# Patient Record
Sex: Female | Born: 1975
Health system: Southern US, Community
[De-identification: ages and names within clinical notes are randomized; demographics above are authoritative.]

## PROBLEM LIST (undated history)

## (undated) DIAGNOSIS — K469 Unspecified abdominal hernia without obstruction or gangrene: Secondary | ICD-10-CM

## (undated) DIAGNOSIS — K921 Melena: Secondary | ICD-10-CM

## (undated) DIAGNOSIS — Z87440 Personal history of urinary (tract) infections: Secondary | ICD-10-CM

## (undated) DIAGNOSIS — E669 Obesity, unspecified: Secondary | ICD-10-CM

## (undated) DIAGNOSIS — R32 Unspecified urinary incontinence: Secondary | ICD-10-CM

## (undated) HISTORY — PX: APPENDECTOMY: SHX54

## (undated) HISTORY — DX: Personal history of urinary (tract) infections: Z87.440

## (undated) HISTORY — PX: HERNIA REPAIR: SHX51

## (undated) HISTORY — DX: Melena: K92.1

## (undated) HISTORY — DX: Unspecified urinary incontinence: R32

---

## 2005-12-11 HISTORY — PX: TUBAL LIGATION: SHX77

## 2014-10-02 ENCOUNTER — Encounter (HOSPITAL_COMMUNITY): Payer: Self-pay | Admitting: Emergency Medicine

## 2014-10-02 ENCOUNTER — Emergency Department (HOSPITAL_COMMUNITY)
Admission: EM | Admit: 2014-10-02 | Discharge: 2014-10-02 | Disposition: A | Discharge status: Home / Self Care | Payer: No Typology Code available for payment source | Attending: Emergency Medicine | Admitting: Emergency Medicine

## 2014-10-02 ENCOUNTER — Emergency Department (HOSPITAL_COMMUNITY): Payer: No Typology Code available for payment source

## 2014-10-02 DIAGNOSIS — M549 Dorsalgia, unspecified: Secondary | ICD-10-CM | POA: Insufficient documentation

## 2014-10-02 DIAGNOSIS — R109 Unspecified abdominal pain: Secondary | ICD-10-CM

## 2014-10-02 DIAGNOSIS — K429 Umbilical hernia without obstruction or gangrene: Secondary | ICD-10-CM | POA: Insufficient documentation

## 2014-10-02 DIAGNOSIS — R1033 Periumbilical pain: Secondary | ICD-10-CM | POA: Diagnosis not present

## 2014-10-02 DIAGNOSIS — Z720 Tobacco use: Secondary | ICD-10-CM | POA: Insufficient documentation

## 2014-10-02 DIAGNOSIS — Z3202 Encounter for pregnancy test, result negative: Secondary | ICD-10-CM | POA: Insufficient documentation

## 2014-10-02 DIAGNOSIS — R599 Enlarged lymph nodes, unspecified: Secondary | ICD-10-CM | POA: Insufficient documentation

## 2014-10-02 LAB — CBC WITH DIFFERENTIAL/PLATELET
BASOS PCT: 0 % (ref 0–1)
Basophils Absolute: 0 10*3/uL (ref 0.0–0.1)
EOS ABS: 0.3 10*3/uL (ref 0.0–0.7)
Eosinophils Relative: 2 % (ref 0–5)
HCT: 39 % (ref 36.0–46.0)
Hemoglobin: 13.2 g/dL (ref 12.0–15.0)
LYMPHS ABS: 2.7 10*3/uL (ref 0.7–4.0)
Lymphocytes Relative: 20 % (ref 12–46)
MCH: 29.1 pg (ref 26.0–34.0)
MCHC: 33.8 g/dL (ref 30.0–36.0)
MCV: 85.9 fL (ref 78.0–100.0)
MONOS PCT: 5 % (ref 3–12)
Monocytes Absolute: 0.6 10*3/uL (ref 0.1–1.0)
NEUTROS PCT: 73 % (ref 43–77)
Neutro Abs: 9.8 10*3/uL — ABNORMAL HIGH (ref 1.7–7.7)
PLATELETS: 310 10*3/uL (ref 150–400)
RBC: 4.54 MIL/uL (ref 3.87–5.11)
RDW: 13 % (ref 11.5–15.5)
WBC: 13.4 10*3/uL — ABNORMAL HIGH (ref 4.0–10.5)

## 2014-10-02 LAB — URINALYSIS, ROUTINE W REFLEX MICROSCOPIC
Bilirubin Urine: NEGATIVE
Glucose, UA: NEGATIVE mg/dL
KETONES UR: NEGATIVE mg/dL
Leukocytes, UA: NEGATIVE
NITRITE: NEGATIVE
Protein, ur: NEGATIVE mg/dL
Specific Gravity, Urine: 1.005 (ref 1.005–1.030)
UROBILINOGEN UA: 0.2 mg/dL (ref 0.0–1.0)
pH: 5.5 (ref 5.0–8.0)

## 2014-10-02 LAB — BASIC METABOLIC PANEL
Anion gap: 8 (ref 5–15)
BUN: 9 mg/dL (ref 6–23)
CALCIUM: 8.7 mg/dL (ref 8.4–10.5)
CO2: 27 mEq/L (ref 19–32)
Chloride: 106 mEq/L (ref 96–112)
Creatinine, Ser: 0.87 mg/dL (ref 0.50–1.10)
GFR, EST NON AFRICAN AMERICAN: 83 mL/min — AB (ref >90)
GLUCOSE: 72 mg/dL (ref 70–99)
POTASSIUM: 4.1 meq/L (ref 3.7–5.3)
SODIUM: 141 meq/L (ref 137–147)

## 2014-10-02 LAB — URINE MICROSCOPIC-ADD ON: RBC / HPF: NONE SEEN RBC/hpf (ref <3)

## 2014-10-02 LAB — POC URINE PREG, ED: PREG TEST UR: NEGATIVE

## 2014-10-02 MED ORDER — METHOCARBAMOL 500 MG PO TABS: 500.0000 mg | ORAL_TABLET | Freq: Two times a day (BID) | ORAL | Status: DC

## 2014-10-02 MED ORDER — OXYCODONE-ACETAMINOPHEN 5-325 MG PO TABS
1.0000 | ORAL_TABLET | Freq: Once | ORAL | Status: AC
  Administered 2014-10-02: 1 via ORAL
  Filled 2014-10-02: qty 1

## 2014-10-02 MED ORDER — MORPHINE SULFATE 4 MG/ML IJ SOLN
4.0000 mg | Freq: Once | INTRAMUSCULAR | Status: AC
  Administered 2014-10-02: 4 mg via INTRAVENOUS
  Filled 2014-10-02: qty 1

## 2014-10-02 NOTE — Discharge Instructions (Signed)
Return to the emergency room with worsening of symptoms, new symptoms or with symptoms that are concerning, especially fevers, loss of control of bladder or bowels, numbness or tingling around genital region or anus, weakness. Also if your hernia becomes painful, hard, red with severe abdominal pain, nausea, vomiting, fevers. RICE: Rest, Ice (three cycles of 20 mins on, 20mins off at least twice a day), compression/brace, elevation. Heating pad works well for back pain. Ibuprofen 400mg  (2 tablets 200mg ) every 5-6 hours for 3-5 days and then as needed for pain. Muscle relaxer for severe pain. Do not operate machinery, drive or drink alcohol while taking narcotics or muscle relaxers. Follow up with PCP if symptoms worsen or are persistent.

## 2014-10-02 NOTE — ED Provider Notes (Signed)
CSN: [id number]     Arrival date & time 10/02/14  1641 History   First MD Initiated Contact with Patient 10/02/14 1723     Chief Complaint  Patient presents with  . Flank Pain     (Consider location/radiation/quality/duration/timing/severity/associated sxs/prior Treatment) HPI Grace Todd is a 38 y.o. female presenting with left sided flank pain that is sharp, crampy started at 2:30pm today. Pain is constant but acutely worsens every 5mins or so. Pain does not radiate. Worse when patient bends over. Patient denies hematuria, dysuria, foul odor to urine but endorses frequency. Patient has never had pain like this before. No nausea, emesis. No abdominal pain, chest pain, SOB. No fevers, chills. Patient back denies injury or history of back pain. LMP started 3 days ago but this is unlike menses pain. No vaginal complaints. No history of kidney stones. No fevers, chills, night sweats, weight loss, IVDU, history of malignancy. No loss of control of bladder or bowel. No numbness/tingling, weakness or saddle anesthesia.     History reviewed. No pertinent past medical history. History reviewed. No pertinent past surgical history. History reviewed. No pertinent family history. History  Substance Use Topics  . Smoking status: Current Every Day Smoker    Types: Cigarettes  . Smokeless tobacco: Not on file  . Alcohol Use: No   OB History   Grav Para Term Preterm Abortions TAB SAB Ect Mult Living                 Review of Systems  Constitutional: Negative for fever and chills.  HENT: Negative for congestion and rhinorrhea.   Eyes: Negative for visual disturbance.  Respiratory: Negative for cough and shortness of breath.   Cardiovascular: Negative for chest pain and palpitations.  Gastrointestinal: Negative for nausea, vomiting and diarrhea.  Genitourinary: Negative for dysuria and hematuria.  Musculoskeletal: Positive for back pain. Negative for gait problem.  Skin: Negative for rash.   Neurological: Negative for weakness and headaches.      Allergies  Sulfa antibiotics  Home Medications   Prior to Admission medications   Not on File   BP 131/87  Pulse 82  Temp(Src) 98.2 F (36.8 C) (Oral)  Resp 16  Wt 258 lb 5 oz (117.17 kg)  SpO2 98%  LMP 09/30/2014 Physical Exam  Nursing note and vitals reviewed. Constitutional: She appears well-developed and well-nourished. No distress.  HENT:  Head: Normocephalic and atraumatic.  Eyes: Conjunctivae are normal. Right eye exhibits no discharge. Left eye exhibits no discharge.  Cardiovascular: Normal rate, regular rhythm and normal heart sounds.   Pulmonary/Chest: Effort normal and breath sounds normal. No respiratory distress. She has no wheezes.  Abdominal: Soft. Bowel sounds are normal. She exhibits no distension. There is no tenderness.  5cm umbilical hernia without tenderness. Non firm without erythema, or skin changes.  Musculoskeletal:  No midline back tenderness, step off or crepitus.  Left sided flank back tenderness. No CVA tenderness.   Neurological: She is alert. Coordination normal.  Equal muscle tone. 5/5 strength in lower extremities. DTR equal and intact. Negative straight leg test. Normal gait.   Skin: Skin is warm and dry. She is not diaphoretic.    ED Course  Procedures (including critical care time) Labs Review Labs Reviewed  URINALYSIS, ROUTINE W REFLEX MICROSCOPIC  CBC WITH DIFFERENTIAL  BASIC METABOLIC PANEL  POC URINE PREG, ED    Imaging Review No results found.   EKG Interpretation None     Meds given in ED:  Medications  oxyCODONE-acetaminophen (PERCOCET/ROXICET) 5-325 MG per tablet 1 tablet (1 tablet Oral Given 10/02/14 1653)  morphine 4 MG/ML injection 4 mg (4 mg Intravenous Given 10/02/14 1750)    Discharge Medication List as of 10/02/2014  7:02 PM    START taking these medications   Details  methocarbamol (ROBAXIN) 500 MG tablet Take 1 tablet (500 mg total) by  mouth 2 (two) times daily., Starting 10/02/2014, Until Discontinued, Print          MDM   Final diagnoses:  Flank pain   Patient with back pain. No loss of bowel or bladder control. No saddle anesthesia. No fever, night sweats, weight loss, h/o cancer, IVDU. VSS. No neurological deficits and normal neuro exam. Patient can ambulate. No concern for cauda equina.  Patient with hematuria but currently on period. UA without infection. CT renal without signs of nephrolithiasis. CT revealed mesenteric and retroperitoneal lymph nodes and recommends repeat CT in 3-4 months. Patient with PCP appointment in one week and to discuss this recommendation. Tx back pain with RICE protocol and muscle relaxer indicated and discussed with patient. Driving and sedation precautions provided. Patient is afebrile, nontoxic, and in no acute distress. Patient is appropriate for outpatient management and is stable for discharge.  Discussed return precautions with patient. Discussed all results and patient verbalizes understanding and agrees with plan.  Case has been discussed with Dr. Judd Lienelo who agrees with the above plan and to discharge.      Louann SjogrenVictoria L Lynda Wanninger, PA-C 10/02/14 2233

## 2014-10-02 NOTE — ED Notes (Signed)
Presents with left sided flank pain began at 2:30 today. Denies injury, pain is described as cramping and comes and goes, worse when bends over and moves. reports urinary frequency. Denies SOB. Pain does not radiate or move.

## 2014-10-03 NOTE — ED Provider Notes (Signed)
Medical screening examination/treatment/procedure(s) were performed by non-physician practitioner and as supervising physician I was immediately available for consultation/collaboration.     Geoffery Lyonsouglas Shykeria Sakamoto, MD 10/03/14 [phone]

## 2016-10-13 ENCOUNTER — Encounter: Payer: Self-pay | Admitting: Gastroenterology

## 2016-11-23 DIAGNOSIS — K469 Unspecified abdominal hernia without obstruction or gangrene: Secondary | ICD-10-CM | POA: Insufficient documentation

## 2016-11-23 DIAGNOSIS — R109 Unspecified abdominal pain: Secondary | ICD-10-CM | POA: Diagnosis present

## 2016-11-23 DIAGNOSIS — F17210 Nicotine dependence, cigarettes, uncomplicated: Secondary | ICD-10-CM | POA: Diagnosis not present

## 2016-11-24 ENCOUNTER — Encounter (HOSPITAL_COMMUNITY): Payer: Self-pay | Admitting: Emergency Medicine

## 2016-11-24 ENCOUNTER — Emergency Department (HOSPITAL_COMMUNITY): Payer: BLUE CROSS/BLUE SHIELD

## 2016-11-24 ENCOUNTER — Emergency Department (HOSPITAL_COMMUNITY)
Admission: EM | Admit: 2016-11-24 | Discharge: 2016-11-24 | Disposition: A | Discharge status: Home / Self Care | Payer: BLUE CROSS/BLUE SHIELD | Attending: Emergency Medicine | Admitting: Emergency Medicine

## 2016-11-24 DIAGNOSIS — K469 Unspecified abdominal hernia without obstruction or gangrene: Secondary | ICD-10-CM

## 2016-11-24 HISTORY — DX: Obesity, unspecified: E66.9

## 2016-11-24 HISTORY — DX: Unspecified abdominal hernia without obstruction or gangrene: K46.9

## 2016-11-24 LAB — I-STAT CG4 LACTIC ACID, ED: Lactic Acid, Venous: 1.13 mmol/L (ref 0.5–1.9)

## 2016-11-24 LAB — COMPREHENSIVE METABOLIC PANEL
ALK PHOS: 52 U/L (ref 38–126)
ALT: 21 U/L (ref 14–54)
AST: 20 U/L (ref 15–41)
Albumin: 3.5 g/dL (ref 3.5–5.0)
Anion gap: 6 (ref 5–15)
BUN: 10 mg/dL (ref 6–20)
CO2: 28 mmol/L (ref 22–32)
Calcium: 8.9 mg/dL (ref 8.9–10.3)
Chloride: 106 mmol/L (ref 101–111)
Creatinine, Ser: 0.86 mg/dL (ref 0.44–1.00)
GFR calc Af Amer: >60 mL/min (ref >60)
GFR calc non Af Amer: >60 mL/min (ref >60)
Glucose, Bld: 120 mg/dL — ABNORMAL HIGH (ref 65–99)
POTASSIUM: 4.4 mmol/L (ref 3.5–5.1)
Sodium: 140 mmol/L (ref 135–145)
Total Bilirubin: 0.3 mg/dL (ref 0.3–1.2)
Total Protein: 6.1 g/dL — ABNORMAL LOW (ref 6.5–8.1)

## 2016-11-24 LAB — URINALYSIS, ROUTINE W REFLEX MICROSCOPIC
BILIRUBIN URINE: NEGATIVE
Bacteria, UA: NONE SEEN
GLUCOSE, UA: NEGATIVE mg/dL
Ketones, ur: 5 mg/dL — AB
LEUKOCYTES UA: NEGATIVE
Nitrite: NEGATIVE
PH: 5 (ref 5.0–8.0)
Protein, ur: 30 mg/dL — AB
SPECIFIC GRAVITY, URINE: 1.027 (ref 1.005–1.030)

## 2016-11-24 LAB — CBC
HEMATOCRIT: 38.8 % (ref 36.0–46.0)
Hemoglobin: 12.6 g/dL (ref 12.0–15.0)
MCH: 27.7 pg (ref 26.0–34.0)
MCHC: 32.5 g/dL (ref 30.0–36.0)
MCV: 85.3 fL (ref 78.0–100.0)
PLATELETS: 339 10*3/uL (ref 150–400)
RBC: 4.55 MIL/uL (ref 3.87–5.11)
RDW: 13.8 % (ref 11.5–15.5)
WBC: 17.1 10*3/uL — AB (ref 4.0–10.5)

## 2016-11-24 LAB — LIPASE, BLOOD: Lipase: 31 U/L (ref 11–51)

## 2016-11-24 LAB — POC URINE PREG, ED: Preg Test, Ur: NEGATIVE

## 2016-11-24 MED ORDER — OXYCODONE-ACETAMINOPHEN 5-325 MG PO TABS: 1.0000 | ORAL_TABLET | Freq: Four times a day (QID) | ORAL | 0 refills | Status: DC | PRN

## 2016-11-24 MED ORDER — IOPAMIDOL (ISOVUE-300) INJECTION 61%
INTRAVENOUS | Status: AC
  Administered 2016-11-24: 100 mL
  Filled 2016-11-24: qty 100

## 2016-11-24 MED ORDER — ONDANSETRON HCL 4 MG/2ML IJ SOLN
4.0000 mg | Freq: Once | INTRAMUSCULAR | Status: AC
  Administered 2016-11-24: 4 mg via INTRAVENOUS
  Filled 2016-11-24: qty 2

## 2016-11-24 MED ORDER — SODIUM CHLORIDE 0.9 % IV SOLN
INTRAVENOUS | Status: DC
  Administered 2016-11-24: 01:00:00 via INTRAVENOUS

## 2016-11-24 MED ORDER — HYDROMORPHONE HCL 2 MG/ML IJ SOLN
1.0000 mg | Freq: Once | INTRAMUSCULAR | Status: AC
  Administered 2016-11-24: 1 mg via INTRAVENOUS
  Filled 2016-11-24: qty 1

## 2016-11-24 MED ORDER — ONDANSETRON 4 MG PO TBDP: 4.0000 mg | ORAL_TABLET | Freq: Three times a day (TID) | ORAL | 0 refills | Status: DC | PRN

## 2016-11-24 NOTE — ED Notes (Signed)
 1mg  of Dilaudid WIS and verified w/ Victorino DikeJennifer, RN.

## 2016-11-24 NOTE — ED Provider Notes (Signed)
By signing my name below, I, Alyssa GroveMartin Green, attest that this documentation has been prepared under the direction and in the presence of Yianni Skilling N Zebulon Gantt, DO. Electronically Signed: Alyssa GroveMartin Green, ED Scribe. 11/24/16. 1:04 AM.   TIME SEEN: 12:55 AM   CHIEF COMPLAINT: Abdominal Pain; Vomiting  HPI: Grace Todd is a 40 y.o. female who presents to the Emergency Department complaining of gradual onset, constant, abdominal pain onset 9 PM tonight. She reports associated diarrhea, vomiting, nausea, lightheadedness and abdominal distension. At 9 PM pt, started to feel nauseous and lightheaded before abdominal pain significantly increased. She has thrown up 2x today, once at work and once later tonight with a small amount of blood. Pt has hx of hernia that she states slightly swells month to month, but causes no significant pain. States that tonight it has been significantly swollen and unable to reduce. This is abnormal for her. She has eaten a Big Mac for dinner at 8 PM and a steak biscuit for breakfast. Abdominal PSHx of Tubal ligation. She denies blood in stool, melena, hematuria, recent abnormal vaginal bleeding or discharge and dysuria. Pt states she is currently on her period.  ROS: See HPI Constitutional: no fever  Eyes: no drainage  ENT: no runny nose   Cardiovascular:  no chest pain  Resp: no SOB  GI: Vomiting GU: no dysuria Integumentary: no rash  Allergy: no hives  Musculoskeletal: no leg swelling  Neurological: no slurred speech ROS otherwise negative  PAST MEDICAL HISTORY/PAST SURGICAL HISTORY:  Past Medical History:  Diagnosis Date  . Abdominal hernia   . Obesity     MEDICATIONS:  Prior to Admission medications   Medication Sig Start Date End Date Taking? Authorizing Provider  methocarbamol (ROBAXIN) 500 MG tablet Take 1 tablet (500 mg total) by mouth 2 (two) times daily. 10/02/14   Oswaldo ConroyVictoria Creech, PA-C    ALLERGIES:  Allergies  Allergen Reactions  . Sulfa Antibiotics  Hives    SOCIAL HISTORY:  Social History  Substance Use Topics  . Smoking status: Current Every Day Smoker    Types: Cigarettes  . Smokeless tobacco: Never Used  . Alcohol use No    FAMILY HISTORY: No family history on file.  EXAM: BP (!) 161/107 (BP Location: Right Arm)   Pulse 77   Temp 98.4 F (36.9 C) (Oral)   Resp 22   LMP 11/23/2016   SpO2 100%  CONSTITUTIONAL: Alert and oriented and responds appropriately to questions. Appears very uncomfortable, crying HEAD: Normocephalic EYES: Conjunctivae clear, PERRL, EOMI ENT: normal nose; no rhinorrhea; moist mucous membranes NECK: Supple, no meningismus, no nuchal rigidity, no LAD  CARD: RRR; S1 and S2 appreciated; no murmurs, no clicks, no rubs, no gallops RESP: Normal chest excursion without splinting or tachypnea; breath sounds clear and equal bilaterally; no wheezes, no rhonchi, no rales, no hypoxia or respiratory distress, speaking full sentences ABD/GI: Normal bowel sounds; non-distended; soft, tender to palpation throughout the abdomen especially over the umbilical hernia which is approximate 6 x 6 cm with mild amount of overlying ecchymosis but no redness or warmth, induration or fluctuance. This area is slightly hard to palpation and I am unable to reduce it.  No rebound, no guarding, no peritoneal signs, no hepatosplenomegaly BACK:  The back appears normal and is non-tender to palpation, there is no CVA tenderness EXT: Normal ROM in all joints; non-tender to palpation; no edema; normal capillary refill; no cyanosis, no calf tenderness or swelling    SKIN: Normal color for age  and race; warm; no rash NEURO: Moves all extremities equally, sensation to light touch intact diffusely, cranial nerves II through XII intact, normal speech PSYCH: The patient's mood and manner are appropriate. Grooming and personal hygiene are appropriate.  MEDICAL DECISION MAKING: Patient here with what looks like an incarcerated hernia. I am unable  to reduce it at bedside and she appears free uncomfortable. Labs ordered in triage show leukocytosis but otherwise unremarkable. I have discussed with Dr. Andrey CampanileWilson with general surgery. He recommends keeping the patient NPO, obtaining a CT with IV contrast only and placing an NG tube. Patient is receiving IV fluids, pain and nausea medication.  ED PROGRESS: 1:45 AM  On reevaluation, patient's pain is improved and I am able to reduce her hernia. Have contacted general surgery to update Dr. Andrey CampanileWilson. Appreciate his help. Will hold on NG tube at this time. We'll proceed with CT scan she states she is still very uncomfortable.  2:55 AM  Pt's CT scan shows increase of the size a fat-containing hernia superior to the umbilicus measuring 8.7 cm. There is increased density of herniated fat and small volume of fluid that may represent incarceration and possible fat infarct. Patient still having significant amount of pain although improved. It doesn't appear that she has any bowel in her hernia at this time and I am able to palpate her hernia and reduce it with some tenderness but no sign of bowel incarceration. I do not feel that there is anything that needs to be done emergently but will discuss with general surgery.  Otherwise CT scan is unremarkable. Appendix is normal.   3:15 AM  Pt reports feeling better. Abdomen is still soft, nontender to the with reducible hernia. Pleural fluid challenge patient. Discussed with Dr. Andrey CampanileWilson with general surgery. We appreciate his help. He agrees that there is nothing that would be done emergently for incarcerated fat but will give her close outpatient follow-up with surgery. Have discussed with her return precautions including worsening pain, fever, vomiting that will not stop. Have discussed with her and her husband how to try to reduce her hernia at home if she begins having pain, increased bulging in this area. She verbalizes understanding and is comfortable with this plan. We'll  discharge with Percocet, Zofran.   At this time, I do not feel there is any life-threatening condition present. I have reviewed and discussed all results (EKG, imaging, lab, urine as appropriate) and exam findings with patient/family. I have reviewed nursing notes and appropriate previous records.  I feel the patient is safe to be discharged home without further emergent workup and can continue workup as an outpatient as needed. Discussed usual and customary return precautions. Patient/family verbalize understanding and are comfortable with this plan.  Outpatient follow-up has been provided. All questions have been answered.   I personally performed the services described in this documentation, which was scribed in my presence. The recorded information has been reviewed and is accurate.    Layla MawKristen N Halvor Behrend, DO 11/24/16 [id number]

## 2016-11-24 NOTE — ED Triage Notes (Signed)
Pt. reports mid/lower abdominal pain with nausea and emesis onset this evening after eating BigMac , denies diarrhea or fever , pt. added umbilical hernia that is increasing in size this week .

## 2016-12-15 ENCOUNTER — Other Ambulatory Visit: Payer: Self-pay | Admitting: Surgery

## 2016-12-18 ENCOUNTER — Ambulatory Visit: Payer: No Typology Code available for payment source | Admitting: Gastroenterology

## 2017-09-06 ENCOUNTER — Encounter: Payer: Self-pay | Admitting: Family Medicine

## 2018-02-14 ENCOUNTER — Other Ambulatory Visit: Payer: Self-pay

## 2018-02-14 ENCOUNTER — Encounter (HOSPITAL_COMMUNITY): Admission: EM | Disposition: A | Discharge status: Home Health | Payer: Self-pay | Source: Home / Self Care

## 2018-02-14 ENCOUNTER — Emergency Department (HOSPITAL_COMMUNITY): Payer: BLUE CROSS/BLUE SHIELD | Admitting: Anesthesiology

## 2018-02-14 ENCOUNTER — Emergency Department (HOSPITAL_COMMUNITY): Payer: BLUE CROSS/BLUE SHIELD

## 2018-02-14 ENCOUNTER — Inpatient Hospital Stay (HOSPITAL_COMMUNITY)
Admission: EM | Admit: 2018-02-14 | Discharge: 2018-02-17 | DRG: 354 | Disposition: A | Discharge status: Home Health | Payer: BLUE CROSS/BLUE SHIELD | Attending: General Surgery | Admitting: General Surgery

## 2018-02-14 ENCOUNTER — Encounter (HOSPITAL_COMMUNITY): Payer: Self-pay

## 2018-02-14 DIAGNOSIS — Z6841 Body Mass Index (BMI) 40.0 and over, adult: Secondary | ICD-10-CM | POA: Diagnosis not present

## 2018-02-14 DIAGNOSIS — R109 Unspecified abdominal pain: Secondary | ICD-10-CM | POA: Diagnosis present

## 2018-02-14 DIAGNOSIS — F17210 Nicotine dependence, cigarettes, uncomplicated: Secondary | ICD-10-CM | POA: Diagnosis present

## 2018-02-14 DIAGNOSIS — K450 Other specified abdominal hernia with obstruction, without gangrene: Secondary | ICD-10-CM

## 2018-02-14 DIAGNOSIS — K436 Other and unspecified ventral hernia with obstruction, without gangrene: Secondary | ICD-10-CM | POA: Diagnosis present

## 2018-02-14 DIAGNOSIS — D72829 Elevated white blood cell count, unspecified: Secondary | ICD-10-CM | POA: Diagnosis present

## 2018-02-14 HISTORY — PX: LAPAROSCOPY: SHX197

## 2018-02-14 HISTORY — PX: UMBILICAL HERNIA REPAIR: SHX196

## 2018-02-14 LAB — BASIC METABOLIC PANEL
ANION GAP: 9 (ref 5–15)
BUN: 13 mg/dL (ref 6–20)
CALCIUM: 9 mg/dL (ref 8.9–10.3)
CO2: 23 mmol/L (ref 22–32)
Chloride: 105 mmol/L (ref 101–111)
Creatinine, Ser: 0.8 mg/dL (ref 0.44–1.00)
Glucose, Bld: 116 mg/dL — ABNORMAL HIGH (ref 65–99)
POTASSIUM: 4 mmol/L (ref 3.5–5.1)
SODIUM: 137 mmol/L (ref 135–145)

## 2018-02-14 LAB — CBC WITH DIFFERENTIAL/PLATELET
BASOS ABS: 0 10*3/uL (ref 0.0–0.1)
BASOS PCT: 0 %
EOS PCT: 0 %
Eosinophils Absolute: 0 10*3/uL (ref 0.0–0.7)
HCT: 39.1 % (ref 36.0–46.0)
Hemoglobin: 13 g/dL (ref 12.0–15.0)
LYMPHS PCT: 8 %
Lymphs Abs: 1.6 10*3/uL (ref 0.7–4.0)
MCH: 28.6 pg (ref 26.0–34.0)
MCHC: 33.2 g/dL (ref 30.0–36.0)
MCV: 85.9 fL (ref 78.0–100.0)
Monocytes Absolute: 0.3 10*3/uL (ref 0.1–1.0)
Monocytes Relative: 2 %
Neutro Abs: 19 10*3/uL — ABNORMAL HIGH (ref 1.7–7.7)
Neutrophils Relative %: 90 %
PLATELETS: 350 10*3/uL (ref 150–400)
RBC: 4.55 MIL/uL (ref 3.87–5.11)
RDW: 13.9 % (ref 11.5–15.5)
WBC: 20.9 10*3/uL — AB (ref 4.0–10.5)

## 2018-02-14 LAB — I-STAT BETA HCG BLOOD, ED (MC, WL, AP ONLY): I-stat hCG, quantitative: <5 m[IU]/mL (ref <5)

## 2018-02-14 SURGERY — LAPAROSCOPY, DIAGNOSTIC
Anesthesia: General | Site: Abdomen

## 2018-02-14 MED ORDER — ONDANSETRON HCL 4 MG/2ML IJ SOLN: 4.0000 mg | Freq: Once | INTRAMUSCULAR | Status: DC | PRN

## 2018-02-14 MED ORDER — ROCURONIUM BROMIDE 100 MG/10ML IV SOLN
INTRAVENOUS | Status: DC | PRN
  Administered 2018-02-14: 50 mg via INTRAVENOUS
  Administered 2018-02-14: 20 mg via INTRAVENOUS

## 2018-02-14 MED ORDER — HYDROMORPHONE HCL 1 MG/ML IJ SOLN
INTRAMUSCULAR | Status: AC
  Filled 2018-02-14: qty 1

## 2018-02-14 MED ORDER — SUCCINYLCHOLINE CHLORIDE 20 MG/ML IJ SOLN
INTRAMUSCULAR | Status: AC
  Filled 2018-02-14: qty 1

## 2018-02-14 MED ORDER — ACETAMINOPHEN 325 MG PO TABS
650.0000 mg | ORAL_TABLET | Freq: Four times a day (QID) | ORAL | Status: DC
  Administered 2018-02-15 – 2018-02-17 (×8): 650 mg via ORAL
  Filled 2018-02-14 (×9): qty 2

## 2018-02-14 MED ORDER — HYDROMORPHONE HCL 1 MG/ML IJ SOLN
0.2500 mg | INTRAMUSCULAR | Status: DC | PRN
  Administered 2018-02-14: 0.5 mg via INTRAVENOUS
  Administered 2018-02-14: 0.25 mg via INTRAVENOUS
  Administered 2018-02-14 (×2): 0.5 mg via INTRAVENOUS

## 2018-02-14 MED ORDER — PANTOPRAZOLE SODIUM 40 MG IV SOLR
40.0000 mg | Freq: Every day | INTRAVENOUS | Status: DC
  Administered 2018-02-15 (×2): 40 mg via INTRAVENOUS
  Filled 2018-02-14 (×2): qty 40

## 2018-02-14 MED ORDER — KETOROLAC TROMETHAMINE 30 MG/ML IJ SOLN
30.0000 mg | Freq: Three times a day (TID) | INTRAMUSCULAR | Status: AC
  Administered 2018-02-14 – 2018-02-16 (×6): 30 mg via INTRAVENOUS
  Filled 2018-02-14 (×6): qty 1

## 2018-02-14 MED ORDER — MIDAZOLAM HCL 5 MG/5ML IJ SOLN
INTRAMUSCULAR | Status: DC | PRN
  Administered 2018-02-14: 2 mg via INTRAVENOUS

## 2018-02-14 MED ORDER — PROPOFOL 10 MG/ML IV BOLUS
INTRAVENOUS | Status: AC
  Filled 2018-02-14: qty 40

## 2018-02-14 MED ORDER — DEXAMETHASONE SODIUM PHOSPHATE 10 MG/ML IJ SOLN
INTRAMUSCULAR | Status: DC | PRN
  Administered 2018-02-14: 10 mg via INTRAVENOUS

## 2018-02-14 MED ORDER — DEXAMETHASONE SODIUM PHOSPHATE 10 MG/ML IJ SOLN
INTRAMUSCULAR | Status: AC
  Filled 2018-02-14: qty 1

## 2018-02-14 MED ORDER — FENTANYL CITRATE (PF) 250 MCG/5ML IJ SOLN
INTRAMUSCULAR | Status: AC
  Filled 2018-02-14: qty 5

## 2018-02-14 MED ORDER — FENTANYL CITRATE (PF) 250 MCG/5ML IJ SOLN
INTRAMUSCULAR | Status: DC | PRN
  Administered 2018-02-14 (×7): 50 ug via INTRAVENOUS

## 2018-02-14 MED ORDER — ONDANSETRON HCL 4 MG/2ML IJ SOLN
INTRAMUSCULAR | Status: AC
  Filled 2018-02-14: qty 2

## 2018-02-14 MED ORDER — EPHEDRINE 5 MG/ML INJ
INTRAVENOUS | Status: AC
  Filled 2018-02-14: qty 10

## 2018-02-14 MED ORDER — ROCURONIUM BROMIDE 10 MG/ML (PF) SYRINGE
PREFILLED_SYRINGE | INTRAVENOUS | Status: AC
  Filled 2018-02-14: qty 5

## 2018-02-14 MED ORDER — LACTATED RINGERS IV SOLN
INTRAVENOUS | Status: DC | PRN
  Administered 2018-02-14 (×2): via INTRAVENOUS

## 2018-02-14 MED ORDER — BUPIVACAINE-EPINEPHRINE (PF) 0.5% -1:200000 IJ SOLN
INTRAMUSCULAR | Status: AC
  Filled 2018-02-14: qty 30

## 2018-02-14 MED ORDER — BUPIVACAINE-EPINEPHRINE (PF) 0.5% -1:200000 IJ SOLN
INTRAMUSCULAR | Status: DC | PRN
  Administered 2018-02-14: 30 mL via PERINEURAL

## 2018-02-14 MED ORDER — SUGAMMADEX SODIUM 200 MG/2ML IV SOLN
INTRAVENOUS | Status: DC | PRN
  Administered 2018-02-14: 300 mg via INTRAVENOUS

## 2018-02-14 MED ORDER — ONDANSETRON HCL 4 MG/2ML IJ SOLN
4.0000 mg | Freq: Once | INTRAMUSCULAR | Status: AC
  Administered 2018-02-14: 4 mg via INTRAVENOUS
  Filled 2018-02-14: qty 2

## 2018-02-14 MED ORDER — MEPERIDINE HCL 50 MG/ML IJ SOLN: 6.2500 mg | INTRAMUSCULAR | Status: DC | PRN

## 2018-02-14 MED ORDER — ONDANSETRON HCL 4 MG/2ML IJ SOLN: 4.0000 mg | Freq: Four times a day (QID) | INTRAMUSCULAR | Status: DC | PRN

## 2018-02-14 MED ORDER — BUPIVACAINE HCL (PF) 0.25 % IJ SOLN
INTRAMUSCULAR | Status: AC
  Filled 2018-02-14: qty 30

## 2018-02-14 MED ORDER — MORPHINE SULFATE (PF) 4 MG/ML IV SOLN: 1.0000 mg | INTRAVENOUS | Status: DC | PRN

## 2018-02-14 MED ORDER — METHOCARBAMOL 500 MG PO TABS
500.0000 mg | ORAL_TABLET | Freq: Four times a day (QID) | ORAL | Status: DC | PRN
  Administered 2018-02-15 – 2018-02-17 (×5): 500 mg via ORAL
  Filled 2018-02-14 (×5): qty 1

## 2018-02-14 MED ORDER — CEFAZOLIN SODIUM 1 G IJ SOLR
INTRAMUSCULAR | Status: AC
  Filled 2018-02-14: qty 20

## 2018-02-14 MED ORDER — MIDAZOLAM HCL 2 MG/2ML IJ SOLN
INTRAMUSCULAR | Status: AC
  Filled 2018-02-14: qty 2

## 2018-02-14 MED ORDER — PROPOFOL 10 MG/ML IV BOLUS
INTRAVENOUS | Status: DC | PRN
  Administered 2018-02-14: 160 mg via INTRAVENOUS

## 2018-02-14 MED ORDER — SUCCINYLCHOLINE CHLORIDE 20 MG/ML IJ SOLN
INTRAMUSCULAR | Status: DC | PRN
  Administered 2018-02-14: 100 mg via INTRAVENOUS

## 2018-02-14 MED ORDER — KCL IN DEXTROSE-NACL 20-5-0.45 MEQ/L-%-% IV SOLN
INTRAVENOUS | Status: DC
  Administered 2018-02-14 – 2018-02-16 (×4): via INTRAVENOUS
  Filled 2018-02-14 (×4): qty 1000

## 2018-02-14 MED ORDER — ONDANSETRON 4 MG PO TBDP: 4.0000 mg | ORAL_TABLET | Freq: Four times a day (QID) | ORAL | Status: DC | PRN

## 2018-02-14 MED ORDER — 0.9 % SODIUM CHLORIDE (POUR BTL) OPTIME
TOPICAL | Status: DC | PRN
  Administered 2018-02-14: 1000 mL

## 2018-02-14 MED ORDER — HYDROMORPHONE HCL 1 MG/ML IJ SOLN
1.0000 mg | Freq: Once | INTRAMUSCULAR | Status: AC
  Administered 2018-02-14: 1 mg via INTRAVENOUS
  Filled 2018-02-14: qty 1

## 2018-02-14 MED ORDER — OXYCODONE HCL 5 MG PO TABS
5.0000 mg | ORAL_TABLET | ORAL | Status: DC | PRN
  Administered 2018-02-15 – 2018-02-16 (×5): 10 mg via ORAL
  Filled 2018-02-14 (×5): qty 2

## 2018-02-14 MED ORDER — SODIUM CHLORIDE 0.9 % IV BOLUS (SEPSIS)
1000.0000 mL | Freq: Once | INTRAVENOUS | Status: AC
  Administered 2018-02-14: 1000 mL via INTRAVENOUS

## 2018-02-14 MED ORDER — GABAPENTIN 300 MG PO CAPS
300.0000 mg | ORAL_CAPSULE | Freq: Two times a day (BID) | ORAL | Status: DC
  Administered 2018-02-15 – 2018-02-17 (×5): 300 mg via ORAL
  Filled 2018-02-14 (×5): qty 1

## 2018-02-14 MED ORDER — IOPAMIDOL (ISOVUE-300) INJECTION 61%
INTRAVENOUS | Status: AC
  Administered 2018-02-14: 100 mL
  Filled 2018-02-14: qty 100

## 2018-02-14 MED ORDER — CEFAZOLIN SODIUM-DEXTROSE 2-4 GM/100ML-% IV SOLN
2.0000 g | Freq: Three times a day (TID) | INTRAVENOUS | Status: DC
  Administered 2018-02-14: 2 g via INTRAVENOUS
  Filled 2018-02-14 (×2): qty 100

## 2018-02-14 MED ORDER — DIPHENHYDRAMINE HCL 12.5 MG/5ML PO ELIX: 12.5000 mg | ORAL_SOLUTION | Freq: Four times a day (QID) | ORAL | Status: DC | PRN

## 2018-02-14 MED ORDER — DIPHENHYDRAMINE HCL 50 MG/ML IJ SOLN: 12.5000 mg | Freq: Four times a day (QID) | INTRAMUSCULAR | Status: DC | PRN

## 2018-02-14 MED ORDER — FENTANYL CITRATE (PF) 250 MCG/5ML IJ SOLN
INTRAMUSCULAR | Status: AC
Start: 2018-02-14 — End: ?
  Filled 2018-02-14: qty 5

## 2018-02-14 MED ORDER — ENOXAPARIN SODIUM 40 MG/0.4ML ~~LOC~~ SOLN
40.0000 mg | SUBCUTANEOUS | Status: DC
  Administered 2018-02-15 – 2018-02-17 (×3): 40 mg via SUBCUTANEOUS
  Filled 2018-02-14 (×3): qty 0.4

## 2018-02-14 MED ORDER — ONDANSETRON HCL 4 MG/2ML IJ SOLN
INTRAMUSCULAR | Status: DC | PRN
  Administered 2018-02-14: 4 mg via INTRAVENOUS

## 2018-02-14 MED ORDER — IOPAMIDOL (ISOVUE-300) INJECTION 61%
INTRAVENOUS | Status: AC
  Filled 2018-02-14: qty 100

## 2018-02-14 MED ORDER — LIDOCAINE HCL (CARDIAC) 20 MG/ML IV SOLN
INTRAVENOUS | Status: DC | PRN
  Administered 2018-02-14: 80 mg via INTRATRACHEAL

## 2018-02-14 MED ORDER — SUGAMMADEX SODIUM 500 MG/5ML IV SOLN
INTRAVENOUS | Status: AC
  Filled 2018-02-14: qty 5

## 2018-02-14 SURGICAL SUPPLY — 57 items
BENZOIN TINCTURE PRP APPL 2/3 (GAUZE/BANDAGES/DRESSINGS) ×2 IMPLANT
BLADE CLIPPER SURG (BLADE) IMPLANT
CANISTER SUCT 3000ML PPV (MISCELLANEOUS) ×2 IMPLANT
CANISTER WOUNDNEG PRESSURE 500 (CANNISTER) ×2 IMPLANT
CHLORAPREP W/TINT 26ML (MISCELLANEOUS) ×2 IMPLANT
COVER SURGICAL LIGHT HANDLE (MISCELLANEOUS) ×2 IMPLANT
DECANTER SPIKE VIAL GLASS SM (MISCELLANEOUS) IMPLANT
DERMABOND ADVANCED (GAUZE/BANDAGES/DRESSINGS)
DERMABOND ADVANCED .7 DNX12 (GAUZE/BANDAGES/DRESSINGS) IMPLANT
DRAPE LAPAROSCOPIC ABDOMINAL (DRAPES) ×2 IMPLANT
DRAPE LAPAROTOMY 100X72 PEDS (DRAPES) IMPLANT
DRAPE UTILITY XL STRL (DRAPES) ×4 IMPLANT
DRAPE WARM FLUID 44X44 (DRAPE) IMPLANT
DRSG VAC ATS MED SENSATRAC (GAUZE/BANDAGES/DRESSINGS) ×2 IMPLANT
ELECT CAUTERY BLADE 6.4 (BLADE) ×2 IMPLANT
ELECT REM PT RETURN 9FT ADLT (ELECTROSURGICAL) ×2
ELECTRODE REM PT RTRN 9FT ADLT (ELECTROSURGICAL) ×1 IMPLANT
GLOVE BIO SURGEON STRL SZ7.5 (GLOVE) ×2 IMPLANT
GLOVE BIOGEL M STRL SZ7.5 (GLOVE) ×2 IMPLANT
GLOVE BIOGEL PI IND STRL 8 (GLOVE) ×2 IMPLANT
GLOVE BIOGEL PI INDICATOR 8 (GLOVE) ×2
GOWN STRL REUS W/ TWL LRG LVL3 (GOWN DISPOSABLE) ×1 IMPLANT
GOWN STRL REUS W/TWL 2XL LVL3 (GOWN DISPOSABLE) ×2 IMPLANT
GOWN STRL REUS W/TWL LRG LVL3 (GOWN DISPOSABLE) ×1
GRASPER SUT TROCAR 14GX15 (MISCELLANEOUS) ×2 IMPLANT
HANDLE SUCTION POOLE (INSTRUMENTS) ×1 IMPLANT
KIT BASIN OR (CUSTOM PROCEDURE TRAY) ×2 IMPLANT
KIT ROOM TURNOVER OR (KITS) ×2 IMPLANT
LIGASURE IMPACT 36 18CM CVD LR (INSTRUMENTS) ×2 IMPLANT
MARKER SKIN DUAL TIP RULER LAB (MISCELLANEOUS) ×2 IMPLANT
NEEDLE HYPO 25GX1X1/2 BEV (NEEDLE) ×2 IMPLANT
NS IRRIG 1000ML POUR BTL (IV SOLUTION) ×2 IMPLANT
PACK SURGICAL SETUP 50X90 (CUSTOM PROCEDURE TRAY) ×2 IMPLANT
PAD ARMBOARD 7.5X6 YLW CONV (MISCELLANEOUS) ×4 IMPLANT
PENCIL BUTTON HOLSTER BLD 10FT (ELECTRODE) ×2 IMPLANT
SCISSORS LAP 5X35 DISP (ENDOMECHANICALS) IMPLANT
SET IRRIG TUBING LAPAROSCOPIC (IRRIGATION / IRRIGATOR) IMPLANT
SHEARS HARMONIC ACE PLUS 36CM (ENDOMECHANICALS) IMPLANT
SLEEVE ENDOPATH XCEL 5M (ENDOMECHANICALS) ×4 IMPLANT
SPONGE LAP 18X18 X RAY DECT (DISPOSABLE) IMPLANT
STRIP CLOSURE SKIN 1/2X4 (GAUZE/BANDAGES/DRESSINGS) ×2 IMPLANT
SUCTION POOLE HANDLE (INSTRUMENTS) ×2
SUT MNCRL AB 4-0 PS2 18 (SUTURE) ×2 IMPLANT
SUT NOVA NAB DX-16 0-1 5-0 T12 (SUTURE) ×2 IMPLANT
SUT PDS AB 1 TP1 96 (SUTURE) ×4 IMPLANT
SUT VIC AB 3-0 SH 18 (SUTURE) IMPLANT
SYR BULB 3OZ (MISCELLANEOUS) IMPLANT
SYR CONTROL 10ML LL (SYRINGE) ×2 IMPLANT
TOWEL OR 17X24 6PK STRL BLUE (TOWEL DISPOSABLE) IMPLANT
TOWEL OR 17X26 10 PK STRL BLUE (TOWEL DISPOSABLE) ×2 IMPLANT
TRAY LAPAROSCOPIC MC (CUSTOM PROCEDURE TRAY) ×2 IMPLANT
TROCAR XCEL BLUNT TIP 100MML (ENDOMECHANICALS) IMPLANT
TROCAR XCEL NON-BLD 11X100MML (ENDOMECHANICALS) IMPLANT
TROCAR XCEL NON-BLD 5MMX100MML (ENDOMECHANICALS) ×2 IMPLANT
TUBE CONNECTING 12X1/4 (SUCTIONS) ×2 IMPLANT
TUBING INSUFFLATION (TUBING) ×2 IMPLANT
YANKAUER SUCT BULB TIP NO VENT (SUCTIONS) ×2 IMPLANT

## 2018-02-14 NOTE — Transfer of Care (Signed)
Immediate Anesthesia Transfer of Care Note  Patient: Grace Todd  Procedure(s) Performed: LAPAROSCOPY DIAGNOSTIC (N/A Abdomen) OPEN PRIMARY REPAIR OF STRANULAGED VENTRAL HERNIA, APPLICATION OF WOUND VAC (N/A Abdomen)  Patient Location: PACU  Anesthesia Type:General  Level of Consciousness: awake  Airway & Oxygen Therapy: Patient Spontanous Breathing and Patient connected to face mask oxygen  Post-op Assessment: Report given to RN and Post -op Vital signs reviewed and stable  Post vital signs: Reviewed and stable  Last Vitals:  Vitals:   02/14/18 1915 02/14/18 1929  BP: (!) 129/44 125/83  Pulse: 64 65  Resp:  18  Temp:    SpO2: 100% 97%    Last Pain:  Vitals:   02/14/18 1929  TempSrc:   PainSc: 7          Complications: No apparent anesthesia complications

## 2018-02-14 NOTE — Brief Op Note (Signed)
02/14/2018  10:05 PM  PATIENT:  Grace Todd  42 y.o. female  PRE-OPERATIVE DIAGNOSIS:  Incarcerated Ventral Hernia  POST-OPERATIVE DIAGNOSIS:  Strangulated Ventral Hernia  PROCEDURE:  Procedure(s) with comments: LAPAROSCOPY DIAGNOSTIC (N/A) - OPEN PRIMARY REPAIR OF STRANGULATED VENTRAL HERNIA (N/A) - APPLICATION OF WOUND VAC  SURGEON:  Surgeon(s) and Role:    Gaynelle Adu* Norinne Jeane, MD - Primary  PHYSICIAN ASSISTANT:   ASSISTANTS: none   ANESTHESIA:   general  EBL:  50 mL   BLOOD ADMINISTERED:none  DRAINS: Urinary Catheter (Foley)   LOCAL MEDICATIONS USED:  MARCAINE     SPECIMEN:  Source of Specimen:  STRANGULATED OMENTUM  DISPOSITION OF SPECIMEN:  PATHOLOGY  COUNTS:  YES  TOURNIQUET:  * No tourniquets in log *  DICTATION: .Other Dictation: Dictation Number [id number]  PLAN OF CARE: Admit to inpatient   PATIENT DISPOSITION:  PACU - hemodynamically stable.   Delay start of Pharmacological VTE agent (>24hrs) due to surgical blood loss or risk of bleeding: no  Mary SellaEric M. Andrey CampanileWilson, MD, FACS General, Bariatric, & Minimally Invasive Surgery [facility] [place]

## 2018-02-14 NOTE — ED Triage Notes (Signed)
Pt presents with incarcerated umbilical hernia that became painful at noon today.  Pt able to reduce herself but has been unable to today.

## 2018-02-14 NOTE — ED Provider Notes (Signed)
[facility] Provider Note   CSN: [id number] Arrival date & time: 02/14/18  1553     History   Chief Complaint Chief Complaint  Patient presents with  . Hernia    HPI Grace Todd is a 42 y.o. female who presents to the ED with abdominal pain. The pain started today at 12:15 pm. Patient reports hx of abdominal hernia that has been there for years. She states that about 2 and a half years ago she came in because she could not reduce it at home and was going for emergency surgery when it finally reduced. Patient reports that today she began having the pain and bulge and tried to lay down, relax and get it back in but has been unsuccessful and now is in severe pain.   HPI  Past Medical History:  Diagnosis Date  . Abdominal hernia   . Obesity     There are no active problems to display for this patient.   History reviewed. No pertinent surgical history.  OB History    No data available       Home Medications    Prior to Admission medications   Medication Sig Start Date End Date Taking? Authorizing Provider  ondansetron (ZOFRAN ODT) 4 MG disintegrating tablet Take 1 tablet (4 mg total) by mouth every 8 (eight) hours as needed for nausea or vomiting. Patient not taking: Reported on 02/14/2018 11/24/16   Ward, Layla MawKristen N, DO  oxyCODONE-acetaminophen (PERCOCET/ROXICET) 5-325 MG tablet Take 1-2 tablets by mouth every 6 (six) hours as needed. Patient not taking: Reported on 02/14/2018 11/24/16   Ward, Layla MawKristen N, DO    Family History History reviewed. No pertinent family history.  Social History Social History   Tobacco Use  . Smoking status: Current Every Day Smoker    Types: Cigarettes  . Smokeless tobacco: Never Used  Substance Use Topics  . Alcohol use: No  . Drug use: No     Allergies   Sulfa antibiotics   Review of Systems Review of Systems  Gastrointestinal: Positive for abdominal pain and nausea.  All other systems  reviewed and are negative.    Physical Exam Updated Vital Signs BP (!) 159/95 (BP Location: Right Arm)   Pulse 62   Temp 98.4 F (36.9 C) (Oral)   Resp 16   Ht 5\' 7"  (1.702 m)   Wt 116.6 kg (257 lb)   LMP 01/30/2018 (Approximate)   SpO2 100%   BMI 40.25 kg/m   Physical Exam  Constitutional: She appears well-developed and well-nourished. No distress.  HENT:  Head: Normocephalic.  Eyes: EOM are normal.  Neck: Neck supple.  Cardiovascular: Normal rate.  Pulmonary/Chest: Effort normal.  Abdominal: There is tenderness in the periumbilical area. There is guarding. A hernia is present.    Musculoskeletal: Normal range of motion.  Neurological: She is alert.  Skin: Skin is warm and dry.  Psychiatric: She has a normal mood and affect.  Nursing note and vitals reviewed.    ED Treatments / Results  Labs (all labs ordered are listed, but only abnormal results are displayed) Labs Reviewed  CBC WITH DIFFERENTIAL/PLATELET - Abnormal; Notable for the following components:      Result Value   WBC 20.9 (*)    Neutro Abs 19.0 (*)    All other components within normal limits  BASIC METABOLIC PANEL - Abnormal; Notable for the following components:   Glucose, Bld 116 (*)    All other components within normal  limits  I-STAT BETA HCG BLOOD, ED ([facility], [facility], [facility] ONLY)    EKG Radiology Ct Abdomen Pelvis W Contrast  Result Date: 02/14/2018 CLINICAL DATA:  Incarcerated umbilical hernia since noon today. EXAM: CT ABDOMEN AND PELVIS WITH CONTRAST TECHNIQUE: Multidetector CT imaging of the abdomen and pelvis was performed using the standard protocol following bolus administration of intravenous contrast. CONTRAST:  ISOVUE-300 IOPAMIDOL (ISOVUE-300) INJECTION 61% COMPARISON:  None. FINDINGS: Lower chest: No acute abnormality. Hepatobiliary: No focal liver abnormality is seen. No gallstones, gallbladder wall thickening, or biliary dilatation. Pancreas: Unremarkable. No pancreatic ductal  dilatation or surrounding inflammatory changes. Spleen: Normal in size without focal abnormality. Adrenals/Urinary Tract: Stable interpolar right renal simple cyst measuring up to 2.6 cm. No nephrolithiasis. Normal adrenal glands. Normal bladder. Stomach/Bowel: Physiologic distention of the stomach with normal small bowel rotation. Herniation of a short segment bowel is now noted in addition to omental fat within a previously described supraumbilical ventral hernia. The mouth of the hernia currently measures 3.8 cm transverse by 2.6 cm craniocaudad. The hernia sac measures approximately 9.2 x 6.8 x 7.3 cm. No proximal dilatation of small bowel leading into this hernia is noted. There is fluid-filled distention of the short segment of bowel seen within this hernia however. Trace amount of fluid is also noted within this hernia sac as before. The colon is unremarkable. No pericecal inflammation. The appendix is not clearly identified. Vascular/Lymphatic: No significant vascular findings are present. No enlarged abdominal or pelvic lymph nodes. Reproductive: Uterus and bilateral adnexa are unremarkable. Fallopian tube clips are seen bilaterally. Other: No free air.  No ascites. 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 IMPRESSION: 1. Redemonstration of a supraumbilical hernia now measuring 9.2 x 6.8 x 7.3 cm, containing omental fat, trace fluid and now a short segment of small bowel. The segment that has herniated into this hernia is distended with fluid. Findings raise concern for incarcerated bowel within the hernia. No proximal upstream small bowel dilatation is noted leading into this hernia however. 2. Stable right interpolar renal cyst measuring 2.6 cm. Electronically Signed   By: Tollie Eth M.D.   On: 02/14/2018 17:56    Procedures tried unsuccessfully to reduce hernia. Dr. Erma Heritage in to see the  patient and tried to reduce the hernia without success.  Procedures (including critical care time)  Medications Ordered in ED Medications  iopamidol (ISOVUE-300) 61 % injection (not administered)  ondansetron (ZOFRAN) injection 4 mg (4 mg Intravenous Given 02/14/18 1619)  HYDROmorphone (DILAUDID) injection 1 mg (1 mg Intravenous Given 02/14/18 1619)  iopamidol (ISOVUE-300) 61 % injection (100 mLs  Contrast Given 02/14/18 1734)     Initial Impression / Assessment and Plan / ED Course  I have reviewed the triage vital signs and the nursing notes. 6:10 pm call to general surgery Dr. Erma Heritage assumed care of the patient and will speak with the general surgeon.   Final Clinical Impressions(s) / ED Diagnoses    ED Discharge Orders    None       Kerrie [place], [place] 02/14/18 Kristeen Mans    Shaune Pollack, MD 02/14/18 [phone]

## 2018-02-14 NOTE — H&P (Signed)
Grace Todd is an 42 y.o. female.   Chief Complaint: abdominal pain HPI: 42 year old female with severe obesity with known supraumbilical hernia comes to the emergency room with acute onset of pain above her umbilicus around noon this afternoon.  This is accompanied by several episodes of nausea and vomiting.  The pain was persistent so she came to the emergency room.  She denies any fevers or chills.  Last bowel movement was this morning.  She has a known supraumbilical hernia.  She came to the emergency room around December 2018 and was found to have an incarcerated supraumbilical hernia.  It was containing fat tissue at that time and was essentially able to be reduced.  She saw [place] in our clinic in January 6283 where umbilical hernia surgery was discussed and the patient elected to proceed with surgery.  However it is unclear why she did not schedule.  Upon asking her this evening she does not recall coming to see [place] in our office or talking with Dr. Ninfa Linden.  She does smoke.  A pack last about 2 days.  She states that she may drink 1 beer per day.  She denies any drugs.  Past Medical History:  Diagnosis Date  . Abdominal hernia   . Obesity     History reviewed. No pertinent surgical history.  History reviewed. No pertinent family history. Social History:  reports that she has been smoking cigarettes.  she has never used smokeless tobacco. She reports that she does not drink alcohol or use drugs.  Allergies:  Allergies  Allergen Reactions  . Sulfa Antibiotics Hives     (Not in a hospital admission)  Results for orders placed or performed during the hospital encounter of 02/14/18 (from the past 48 hour(s))  CBC with Differential/Platelet     Status: Abnormal   Collection Time: 02/14/18  4:00 PM  Result Value Ref Range   WBC 20.9 (H) 4.0 - 10.5 K/uL   RBC 4.55 3.87 - 5.11 MIL/uL   Hemoglobin 13.0 12.0 - 15.0 g/dL   HCT 39.1 36.0 - 46.0 %   MCV 85.9 78.0 - 100.0 fL   MCH 28.6  26.0 - 34.0 pg   MCHC 33.2 30.0 - 36.0 g/dL   RDW 13.9 11.5 - 15.5 %   Platelets 350 150 - 400 K/uL   Neutrophils Relative % 90 %   Neutro Abs 19.0 (H) 1.7 - 7.7 K/uL   Lymphocytes Relative 8 %   Lymphs Abs 1.6 0.7 - 4.0 K/uL   Monocytes Relative 2 %   Monocytes Absolute 0.3 0.1 - 1.0 K/uL   Eosinophils Relative 0 %   Eosinophils Absolute 0.0 0.0 - 0.7 K/uL   Basophils Relative 0 %   Basophils Absolute 0.0 0.0 - 0.1 K/uL    Comment: Performed at [facility], [address] [place], [place] [address]  Basic metabolic panel     Status: Abnormal   Collection Time: 02/14/18  4:00 PM  Result Value Ref Range   Sodium 137 135 - 145 mmol/L   Potassium 4.0 3.5 - 5.1 mmol/L   Chloride 105 101 - 111 mmol/L   CO2 23 22 - 32 mmol/L   Glucose, Bld 116 (H) 65 - 99 mg/dL   BUN 13 6 - 20 mg/dL   Creatinine, Ser 0.80 0.44 - 1.00 mg/dL   Calcium 9.0 8.9 - 10.3 mg/dL   GFR calc non Af Amer >60 >60 mL/min   GFR calc Af Amer >60 >60  mL/min    Comment: (NOTE) The eGFR has been calculated using the CKD EPI equation. This calculation has not been validated in all clinical situations. eGFR's persistently <60 mL/min signify possible Chronic Kidney Disease.    Anion gap 9 5 - 15    Comment: Performed at [facility] [address] [place], [place] [address]  I-Stat Beta hCG blood, ED ([facility], [facility], [facility] only)     Status: None   Collection Time: 02/14/18  4:55 PM  Result Value Ref Range   I-stat hCG, quantitative <5.0 <5 mIU/mL   Comment 3            Comment:   GEST. AGE      CONC.  (mIU/mL)   <=1 WEEK        5 - 50     2 WEEKS       50 - 500     3 WEEKS       100 - 10,000     4 WEEKS     1,000 - 30,000        FEMALE AND NON-PREGNANT FEMALE:     LESS THAN 5 mIU/mL    Ct Abdomen Pelvis W Contrast  Result Date: 02/14/2018 CLINICAL DATA:  Incarcerated umbilical hernia since noon today. EXAM: CT ABDOMEN AND PELVIS WITH CONTRAST TECHNIQUE: Multidetector CT imaging of the abdomen and pelvis  was performed using the standard protocol following bolus administration of intravenous contrast. CONTRAST:  142m ISOVUE-300 IOPAMIDOL (ISOVUE-300) INJECTION 61% COMPARISON:  None. FINDINGS: Lower chest: No acute abnormality. Hepatobiliary: No focal liver abnormality is seen. No gallstones, gallbladder wall thickening, or biliary dilatation. Pancreas: Unremarkable. No pancreatic ductal dilatation or surrounding inflammatory changes. Spleen: Normal in size without focal abnormality. Adrenals/Urinary Tract: Stable interpolar right renal simple cyst measuring up to 2.6 cm. No nephrolithiasis. Normal adrenal glands. Normal bladder. Stomach/Bowel: Physiologic distention of the stomach with normal small bowel rotation. Herniation of a short segment bowel is now noted in addition to omental fat within a previously described supraumbilical ventral hernia. The mouth of the hernia currently measures 3.8 cm transverse by 2.6 cm craniocaudad. The hernia sac measures approximately 9.2 x 6.8 x 7.3 cm. No proximal dilatation of small bowel leading into this hernia is noted. There is fluid-filled distention of the short segment of bowel seen within this hernia however. Trace amount of fluid is also noted within this hernia sac as before. The colon is unremarkable. No pericecal inflammation. The appendix is not clearly identified. Vascular/Lymphatic: No significant vascular findings are present. No enlarged abdominal or pelvic lymph nodes. Reproductive: Uterus and bilateral adnexa are unremarkable. Fallopian tube clips are seen bilaterally. Other: No free air.  No ascites. 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 IMPRESSION: 1. Redemonstration of a supraumbilical hernia now measuring 9.2 x 6.8 x 7.3 cm, containing omental fat, trace fluid and now a short segment of small bowel. The segment that has herniated into this  hernia is distended with fluid. Findings raise concern for incarcerated bowel within the hernia. No proximal upstream small bowel dilatation is noted leading into this hernia however. 2. Stable right interpolar renal cyst measuring 2.6 cm. Electronically Signed   By: DAshley RoyaltyM.D.   On: 02/14/2018 17:56    Review of Systems  Constitutional: Negative for weight loss.  HENT: Negative for nosebleeds.   Eyes: Negative for blurred vision.  Respiratory: Negative  for shortness of breath.   Cardiovascular: Negative for chest pain, palpitations, orthopnea and PND.       Denies DOE  Gastrointestinal: Positive for abdominal pain, nausea and vomiting.  Genitourinary: Negative for dysuria and hematuria.  Musculoskeletal: Negative.   Skin: Negative for itching and rash.  Neurological: Negative for dizziness, focal weakness, seizures, loss of consciousness and headaches.       Denies TIAs, amaurosis fugax  Endo/Heme/Allergies: Does not bruise/bleed easily.  Psychiatric/Behavioral: The patient is not nervous/anxious.     Blood pressure (!) 146/84, pulse 66, temperature 98.4 F (36.9 C), temperature source Oral, resp. rate 16, height 5' 7"  (1.702 m), weight 116.6 kg (257 lb), last menstrual period 01/30/2018, SpO2 100 %. Physical Exam  Vitals reviewed. Constitutional: She is oriented to person, place, and time. She appears well-developed and well-nourished. No distress.  Severe obesity; teary  HENT:  Head: Normocephalic and atraumatic.  Right Ear: External ear normal.  Left Ear: External ear normal.  Eyes: Conjunctivae are normal. No scleral icterus.  Neck: Normal range of motion. Neck supple. No tracheal deviation present. No thyromegaly present.  Cardiovascular: Normal rate and normal heart sounds.  Respiratory: Effort normal and breath sounds normal. No stridor. No respiratory distress. She has no wheezes.  GI: Soft. She exhibits no distension. There is tenderness in the periumbilical area and  suprapubic area. There is guarding (voluntary). There is no rigidity and no rebound. A hernia is present. Hernia confirmed positive in the ventral area.    Obese abdomen; supraumbilical bulge, firm, TTP, some bruising but no cellulitis.   Musculoskeletal: She exhibits no edema or tenderness.  Lymphadenopathy:    She has no cervical adenopathy.  Neurological: She is alert and oriented to person, place, and time. She exhibits normal muscle tone.  Skin: Skin is warm and dry. No rash noted. She is not diaphoretic. No erythema. No pallor.  Psychiatric: She has a normal mood and affect. Her behavior is normal. Judgment and thought content normal.     Assessment/Plan Incarcerated supraumbilical hernia Severe obesity Tobacco use Leukocytosis Nausea, vomiting  She now has small bowel incarcerated in her hernia.  The ED physician attempted to reduce without any success.  I do not believe there is any role in trying to attempt.  She appears uncomfortable and is tearful.  I explained to her and her family member that she now has small bowel in  her hernia and that it is incarcerated with concerns for possible strangulation given her leukocytosis & tenderness.  Hopefully it is just congested and can be salvaged  I recommended diagnostic laparoscopy with open repair of incarcerated supraumbilical hernia.  I told her that it would be unlikely for [place] to use mesh because of the risk of possible infection; therefore, she would be at higher risk for recurrence if we are not able to use mesh.  We also discussed that her obesity and smoking places her at increased risk for recurrence.  We discussed the risk and benefits of surgery including but not limited to bleeding, infection, injury to surrounding structures, hernia recurrence, mesh complications, hematoma/seroma formation,  blood clot formation, urinary retention, post operative ileus, general anesthesia risk, abdominal pain.  We discussed the importance of  avoiding heavy lifting and straining for a period of 4- 6 weeks.  IV abx scds  Leighton Ruff. Redmond Pulling, MD, FACS General, Bariatric, & Minimally Invasive Surgery [facility], [place]  Greer Pickerel, MD 02/14/2018, 7:12 PM

## 2018-02-14 NOTE — ED Notes (Signed)
Patient transported to CT 

## 2018-02-14 NOTE — ED Notes (Signed)
Dr. Andrey CampanileWilson in to assess pt.

## 2018-02-14 NOTE — ED Notes (Signed)
Pt transported to and from CT scanner on stretcher/chair with tech, tolerated well.

## 2018-02-14 NOTE — Anesthesia Procedure Notes (Signed)
Procedure Name: Intubation Date/Time: 02/14/2018 8:22 PM Performed by: Claudina LickMahony, Kae Lauman D, CRNA Pre-anesthesia Checklist: Patient identified, Emergency Drugs available, Suction available, Patient being monitored and Timeout performed Patient Re-evaluated:Patient Re-evaluated prior to induction Oxygen Delivery Method: Circle system utilized Preoxygenation: Pre-oxygenation with 100% oxygen Induction Type: IV induction, Rapid sequence and Cricoid Pressure applied Laryngoscope Size: Miller and 2 Grade View: Grade I Tube type: Oral Tube size: 7.0 mm Number of attempts: 1 Airway Equipment and Method: Stylet Placement Confirmation: ETT inserted through vocal cords under direct vision,  positive ETCO2 and breath sounds checked- equal and bilateral Secured at: 21 cm Tube secured with: Tape Dental Injury: Teeth and Oropharynx as per pre-operative assessment

## 2018-02-14 NOTE — ED Notes (Signed)
Pt is completely undressed and in a gown

## 2018-02-14 NOTE — Anesthesia Preprocedure Evaluation (Addendum)
Anesthesia Evaluation  Patient identified by MRN, date of birth, ID band Patient awake    Reviewed: Allergy & Precautions, NPO status , Patient's Chart, lab work & pertinent test results  Airway Mallampati: II  TM Distance: >3 FB Neck ROM: Full    Dental   Pulmonary Current Smoker,    Pulmonary exam normal        Cardiovascular Normal cardiovascular exam     Neuro/Psych    GI/Hepatic   Endo/Other    Renal/GU      Musculoskeletal   Abdominal   Peds  Hematology   Anesthesia Other Findings   Reproductive/Obstetrics                             Anesthesia Physical Anesthesia Plan  ASA: III and emergent  Anesthesia Plan: General   Post-op Pain Management:    Induction: Intravenous, Rapid sequence and Cricoid pressure planned  PONV Risk Score and Plan: 2 and Ondansetron, Treatment may vary due to age or medical condition, Midazolam and Dexamethasone  Airway Management Planned: Oral ETT  Additional Equipment:   Intra-op Plan:   Post-operative Plan: Extubation in OR  Informed Consent: I have reviewed the patients History and Physical, chart, labs and discussed the procedure including the risks, benefits and alternatives for the proposed anesthesia with the patient or authorized representative who has indicated his/her understanding and acceptance.     Plan Discussed with: CRNA and Surgeon  Anesthesia Plan Comments:        Anesthesia Quick Evaluation

## 2018-02-14 NOTE — Anesthesia Postprocedure Evaluation (Signed)
Anesthesia Post Note  Patient: Salvadore DomFelicia Weihe  Procedure(s) Performed: LAPAROSCOPY DIAGNOSTIC (N/A Abdomen) OPEN PRIMARY REPAIR OF STRANULAGED VENTRAL HERNIA, APPLICATION OF WOUND VAC (N/A Abdomen)     Patient location during evaluation: PACU Anesthesia Type: General Level of consciousness: awake and alert Pain management: pain level controlled Vital Signs Assessment: post-procedure vital signs reviewed and stable Respiratory status: spontaneous breathing, nonlabored ventilation, respiratory function stable and patient connected to nasal cannula oxygen Cardiovascular status: blood pressure returned to baseline and stable Postop Assessment: no apparent nausea or vomiting Anesthetic complications: no    Last Vitals:  Vitals:   02/14/18 1929 02/14/18 2215  BP: 125/83 127/62  Pulse: 65   Resp: 18   Temp:  (!) 36.3 C  SpO2: 97%     Last Pain:  Vitals:   02/14/18 1929  TempSrc:   PainSc: 7                  Wheeler Incorvaia DAVID

## 2018-02-15 ENCOUNTER — Other Ambulatory Visit: Payer: Self-pay

## 2018-02-15 ENCOUNTER — Encounter (HOSPITAL_COMMUNITY): Payer: Self-pay | Admitting: General Surgery

## 2018-02-15 LAB — CBC
HCT: 35.6 % — ABNORMAL LOW (ref 36.0–46.0)
Hemoglobin: 11.7 g/dL — ABNORMAL LOW (ref 12.0–15.0)
MCH: 28.7 pg (ref 26.0–34.0)
MCHC: 32.9 g/dL (ref 30.0–36.0)
MCV: 87.3 fL (ref 78.0–100.0)
Platelets: 315 10*3/uL (ref 150–400)
RBC: 4.08 MIL/uL (ref 3.87–5.11)
RDW: 14.2 % (ref 11.5–15.5)
WBC: 18.8 10*3/uL — ABNORMAL HIGH (ref 4.0–10.5)

## 2018-02-15 LAB — BASIC METABOLIC PANEL
Anion gap: 8 (ref 5–15)
BUN: 8 mg/dL (ref 6–20)
CALCIUM: 8 mg/dL — AB (ref 8.9–10.3)
CO2: 22 mmol/L (ref 22–32)
CREATININE: 0.68 mg/dL (ref 0.44–1.00)
Chloride: 106 mmol/L (ref 101–111)
GFR calc non Af Amer: >60 mL/min (ref >60)
Glucose, Bld: 130 mg/dL — ABNORMAL HIGH (ref 65–99)
Potassium: 4.3 mmol/L (ref 3.5–5.1)
Sodium: 136 mmol/L (ref 135–145)

## 2018-02-15 MED ORDER — OXYCODONE HCL 5 MG PO TABS: 5.0000 mg | ORAL_TABLET | ORAL | 0 refills | Status: DC | PRN

## 2018-02-15 NOTE — Progress Notes (Signed)
Received patient from PACU accompanied by husband. Patient AOx4, with wound VAC on, SCD, VS stable with slightly low BP, pain at 4/10 and O2Sat at 100% on 1 L/min via [place].  Oriented to room, bed controls and call light.  Administered scheduled Toradol IV per order.  Now resting on bed with husband at bedside.  Will monitor.

## 2018-02-15 NOTE — Op Note (Signed)
NAMEMarland Todd  JAANAI, [name]               ACCOUNT NO.:  [id number]  MEDICAL RECORD NO.:  [id number]  LOCATION:  6N07C                        FACILITY:  [facility]  PHYSICIAN:  Mary Sella. Andrey Campanile, MD, FACSDATE OF BIRTH:  12-Jan-1976  DATE OF PROCEDURE:  02/14/2018 DATE OF DISCHARGE:                              OPERATIVE REPORT   PREOPERATIVE DIAGNOSIS:  Incarcerated ventral hernia.  POSTOPERATIVE DIAGNOSIS:  Strangulated ventral hernia.  PROCEDURES: 1. Diagnostic laparoscopy. 2. Open primary repair of strangulated ventral hernia. 3. Application of wound VAC.  SURGEON:  Mary Sella. Andrey Campanile, MD, FACS.  ASSISTANT SURGEON:  None.  ANESTHESIA:  General.  ESTIMATED BLOOD LOSS:  50 mL.  SPECIMEN:  Strangulated omentum.  FINDINGS:  The patient had incarcerated loop of small bowel along with section of omentum.  It was unable to be reduced laparoscopically.  The omentum was nonviable and strangulated and therefore was removed.  The small bowel was inspected and while the small section was hyperemic, it pinked up throughout the case and therefore was deemed viable.  Because there was strangulated omentum with some murky fluid in the hernia sac, I elected not to place the mesh.  The hernia defect was approximately 4 cm by about 3.5 cm.  It was primarily repaired with a running #1 looped PDS from each end with 4 interrupted Novafil sutures serving as retention sutures.  INDICATIONS FOR PROCEDURE:  The patient is a 42 year old, severe obese, smoker who has a longstanding history of a supraumbilical hernia with known omentum.  It was generally partially reducible.  She states that she developed severe abdominal pain earlier this afternoon.  It was persistent.  She had nausea and vomiting and came to the emergency room. She was found to have incarcerated omentum along with now section of small bowel within the hernia.  Her white count was 21,000.  She was uncomfortable.  It was unable to be reduced.   Therefore, I recommended urgent surgical evaluation.  Based on the nature of the CT scan and her physical exam and leukocytosis, I told her it would be highly unlikely for [place] to be able to place a mesh at the time of this procedure given the concern for possibility of the mesh getting infected.  Therefore, she would have a primary repair, which would lead to an increased risk of recurrence.  She already had an elevated risk of recurrence given her obesity and chronic tobacco use.  We discussed at length the risks and benefits including but not limited to bleeding, infection, injury to surrounding structures, need for potential bowel resection, ileus, hernia recurrence, abdominal pain, blood clot formation, wound issues, the typical postoperative course, cardiac and pulmonary events as well as typical recovery.  She elected to proceed with surgery.  DESCRIPTION OF PROCEDURE:  She was taken urgently to the operating room #2 at [facility], placed supine on the operating room table. General endotracheal anesthesia was established.  A Foley catheter was placed.  Her left arm was tucked at her side with the appropriate padding.  Sequential compression devices had been placed.  She received Ancef prior to skin incision.  A surgical time-out was performed.  I elected to do a  diagnostic laparoscopy to see if I would be able to reduce the herniated contents and therefore minimize the length of the skin and soft tissue incision.  I elected to gain access to the abdomen using Optiview technique in the left upper quadrant.  This was all done after the patient had been prepped and draped in the usual standard surgical fashion with ChloraPrep and the surgical time-out had been performed.  A small left upper quadrant subcostal incision was made. Then, using a 0-degree 5-mm laparoscope through a 5-mm trocar, advanced through all layers of the abdominal wall and carefully entered the abdominal  cavity.  Pneumoperitoneum was smoothly established up to a patient pressure of 15 mmHg without any change in the patient's vital signs.  The abdominal cavity was inspected.  There was obvious herniated omentum going through a supraumbilical ventral hernia.  There was also visual evidence of a section of small bowel going up into the hernia and coming back out.  Additional 5-mm trocar was placed in the left lateral abdominal wall under direct visualization.  Then, using an atraumatic bowel grasper, I tried to reduce the hernia contents for approximately 5 minutes, but made no headway whatsoever.  At this point, I decided to convert to open.  Pneumoperitoneum was released.  A midline incision was made directly over the bulge extending it toward the subxiphoid location.  I ended up extending the skin incision a little bit below the umbilicus as well.  Subcutaneous tissue was divided with electrocautery. I got down to the fascia and incised the fascia above the level of the hernia in order to gain access to the abdominal cavity.  I then continued to release some of the herniated contents from the surrounding soft tissue.  This was done with [facility] as well as blunt dissection.  I was able to get down to the level of the hernia and opened up the fascia.  She had a chronically stuck hernia sac.  It was chronically thickened.  There was some murky fluid within the hernia sac.  The omentum was reduced and a loop of bowel reduced spontaneously.  A section of the omentum was nonviable and it was strangulated.  The small bowel was inspected and there was a small segment of about 2 cm that was hyperemic on the mesenteric side.  I decided to reinspect it later.  I ended up excising the hernia sac with electrocautery.  I then extended my fascial incision down to the level of the umbilicus.  The strangulated omentum was removed with ligature, passed off the field. The transverse colon was inspected.  It  appeared normal.  I reinspected the small bowel and administrated a fair segment of small bowel and ran it in its entirety.  There was no evidence of enterotomy or serosal tear.  The section that had been incarcerated was pinking up.  It was no longer severely hyperemic.  It appeared viable.  There was no evidence of stricture.  Therefore, I decided no small bowel resection was needed. Because of the strangulated omentum and the murky fluid within the hernia sac, I felt that placement of mesh would be ill advised. Therefore, I decided that we would close the fascia primarily with a running suture.  Before doing this, the abdomen was irrigated.  The fascia was then reapproximated with a #1 looped running PDS one from above and one from below.  I placed 5 interrupted internal 0 Novafil sutures as internal retention sutures.  I then returned laparoscopically,  inspected the fascial closure.  There was no air leak. There was nothing trapped within the fascial closure.  Local was infiltrated in the preperitoneal space around the midline. Pneumoperitoneum was released and the 2 trocars were removed.  Because there has been murky fluid in the hernia sac, I was concerned about a soft tissue infection.  Therefore, I decided to place a wound VAC.  A medium black sponge was obtained and trimmed to fit the soft tissue incision.  We then placed the typical plastic sheets over the sponge in typical fashion and got a good seal with continuous suction.  The 2 trocar sites were closed with a 4-0 Monocryl followed by application of benzoin, Steri-Strips, and bandages.  The patient was extubated and taken to the recovery room in stable condition.  There were no immediate complications.  The patient tolerated the procedure well.     Mary SellaEric M. Andrey CampanileWilson, MD, FACS     EMW/MEDQ  D:  02/14/2018  T:  02/15/2018  Job:  [id number]

## 2018-02-15 NOTE — Discharge Instructions (Signed)
[facility]      [facility] °[phone] ° °OPEN ABDOMINAL SURGERY: POST OP INSTRUCTIONS ° °Always review your discharge instruction sheet given to you by the facility where your surgery was performed. ° °IF YOU HAVE DISABILITY OR FAMILY LEAVE FORMS, YOU MUST BRING THEM TO THE OFFICE FOR PROCESSING.  PLEASE DO NOT GIVE THEM TO YOUR DOCTOR. ° °1. A prescription for pain medication may be given to you upon discharge.  Take your pain medication as prescribed, if needed.  If narcotic pain medicine is not needed, then you may take acetaminophen (Tylenol) or ibuprofen (Advil) as needed. °2. Take your usually prescribed medications unless otherwise directed. °3. If you need a refill on your pain medication, please contact your pharmacy. They will contact our office to request authorization.  Prescriptions will not be filled after 5pm or on week-ends. °4. You should follow a light diet the first few days after arrival home, such as soup and crackers, pudding, etc.unless your doctor has advised otherwise. A high-fiber, low fat diet can be resumed as tolerated.   Be sure to include lots of fluids daily. Most patients will experience some swelling and bruising on the chest and neck area.  Ice packs will help.  Swelling and bruising can take several days to resolve °5. Most patients will experience some swelling and bruising in the area of the incision. Ice pack will help. Swelling and bruising can take several days to resolve..  °6. It is common to experience some constipation if taking pain medication after surgery.  Increasing fluid intake and taking a stool softener will usually help or prevent this problem from occurring.  A mild laxative (Milk of Magnesia or Miralax) should be taken according to package directions if there are no bowel movements after 48 hours. °7.  You may have steri-strips (small skin tapes) in place directly over the incision.  These strips should be left on the skin for 7-10 days.  If your  surgeon used skin glue on the incision, you may shower in 24 hours.  The glue will flake off over the next 2-3 weeks.  Any sutures or staples will be removed at the office during your follow-up visit. You may find that a light gauze bandage over your incision may keep your staples from being rubbed or pulled. You may shower and replace the bandage daily. °8. ACTIVITIES:  You may resume regular (light) daily activities beginning the next day--such as daily self-care, walking, climbing stairs--gradually increasing activities as tolerated.  You may have sexual intercourse when it is comfortable.  Refrain from any heavy lifting or straining until approved by your doctor. °a. You may drive when you no longer are taking prescription pain medication, you can comfortably wear a seatbelt, and you can safely maneuver your car and apply brakes °b. Return to Work: ___________________________________ °9. You should see your doctor in the office for a follow-up appointment approximately two weeks after your surgery.  Make sure that you call for this appointment within a day or two after you arrive home to insure a convenient appointment time. °OTHER INSTRUCTIONS:  °_____________________________________________________________ °_____________________________________________________________ ° °WHEN TO CALL YOUR DOCTOR: °1. Fever over 101.0 °2. Inability to urinate °3. Nausea and/or vomiting °4. Extreme swelling or bruising °5. Continued bleeding from incision. °6. Increased pain, redness, or drainage from the incision. °7. Difficulty swallowing or breathing °8. Muscle cramping or spasms. °9. Numbness or tingling in hands or feet or around lips. ° °The clinic staff is available to   answer your questions during regular business hours.  Please dont hesitate to call and ask to speak to one of the nurses if you have concerns.  For further questions, please visit [email or web address]  How to change your dressing: Moisten  gauze with saline and wring out as much as possible.  Then use gauze to pack in your wound until the wound is full of gauze.  Cover wound with dry gauze and tape.  Remove dressing prior to getting in the shower.  Replace packing when out of the shower.  Negative Pressure Wound Therapy Dressing Care Negative pressure wound therapy (NPWT) is a device that helps wounds heal. NPWT helps the wound stay clean and healthy while it heals from the inside. NPWT uses a bandage (dressing) that is made of a sponge or gauze-like material. The dressing is placed in or inside the wound. The wound is then covered and sealed with a cover dressing that sticks to your skin (adhesive). This keeps air out. A tube connects the cover dressing to a small pump. The pump sucks fluid and germs from the wound. The pump also controls any odor coming from the wound. What are the risks? NPWT is usually safe to use. The most common problem is skin irritation from the dressing adhesive, but there are many ways to prevent this from happening. However, more serious problems can develop, such as:  Bleeding.  Infection.  Dehydration.  Pain.  How to change your dressing How often you change your dressing depends on your wound. If the pump is off for more than two hours, the dressing will need to be changed. Follow your health care providers instructions on how often to change it. Your health care provider may change your dressing, or a family member, friend, or caregiver may be shown how to change the dressing. It is important to:  Wear gloves and protective clothing while changing a dressing. This may include eye protection.  Never let anyone change your dressing if he or she has an infection, skin condition, or skin wound or cut of any size.  Preparing to change your dressing  If needed, take pain medicine 30 minutes before the dressing change as prescribed by your health care provider.  Set up a clean station for wound  care. You will need: ? A disposable garbage bag that is open and ready to use. ? Hand sanitizer. ? Wound cleanser or saltwater solution (saline) as told by your health care provider. ? New dressing material or bandages. Make sure to open the dressing package so that the dressing remains on the inside of the package. You may also need the following in your clean station:  A box of vinyl gloves.  Tape.  Skin protectant. This may be a wipe, film, or spray.  Clean or germ-free (sterile) scissors.  Wound liner.  Cotton tip applicators.  Removing your old dressing  Wash your hands with soap and water. Dry your hands with a clean towel. If soap and water are not available, use hand sanitizer.  Put on gloves.  Turn off the pump and disconnect the tubing from the dressing.  Carefully remove the adhesive cover dressing in the direction of your hair growth. Only touch the outside edges of the dressing.  Remove the dressing that is inside the wound. If the dressing sticks, use a wound cleanser or saline solution to wet the dressing. This helps it come off more easily.  Throw the old dressing supplies into the ready garbage bag.  Remove your gloves by grabbing the cuff and turning the glove inside out. Place the gloves in the trash immediately.  Wash your hands with soap and water. Dry your hands with a clean towel. If soap and water are not available, use hand sanitizer. Cleaning your wound  Follow your health care provider's instructions on how to clean your wound. This may include using a saline or recommended wound cleanser.  Do not use over-the-counter medicated or antiseptic creams, sprays, liquids, or dressings unless told to do so by your health care provider.  Clean the area thoroughly with the recommended saline solution or wound cleanser and a clean gauze pad.  Throw the gauze pad into the garbage bag.  Wash your hands with soap and water. Dry your hands with a clean  towel. If soap and water are not available, use hand sanitizer. Applying the dressing  Apply a skin protectant to any skin that will be exposed to adhesive. Let the skin protectant dry.  Put a new dressing into the wound.  Apply a new cover dressing and tube.  Take off your gloves. Put them in the plastic bag with the old dressing. Tie the bag shut and throw it away.  Wash your hands with soap and water. Dry your hands with a clean towel. If soap and water are not available, use hand sanitizer.  Attach the suction and turn the pump back on. Do not change the settings on the machine without talking to a health care provider.  Replace the container in the pump that collects fluid if it is full. Do this at least once a week. Contact a health care provider if:  You have new pain.  You develop irritation, a rash, or itching around the wound or dressing.  You see new black or yellow tissue in your wound.  The dressing changes are painful or cause bleeding.  The pump has been off for more than two hours and you do not know how to change the dressing.  The alarm for the pump goes off and you do not know what to do. Get help right away if:  You have a lot of bleeding.  You see a sudden change in the color or texture of the drainage.  The wound breaks open.  You have severe pain.  You have signs of infection, such as: ? More redness, swelling, or pain. ? More fluid or blood. ? Warmth. ? Pus or a bad smell. ? Red streaks leading from wound. ? A fever. This information is not intended to replace advice given to you by your health care provider. Make sure you discuss any questions you have with your health care provider. Document Released: 02/19/2012 Document Revised: 12/23/2015 Document Reviewed: 09/02/2015 Elsevier Interactive Patient Education  [employer].

## 2018-02-15 NOTE — Care Management Note (Addendum)
Case Management Note  Patient Details  Name: Grace Todd MRN: [id number] Date of Birth: 08/01/1976  Subjective/Objective:     Update from [place] PA : VAC dressing will be removal tomorrow prior to discharge and begin wet to dry dressing changes BID . Patient and husband will be taught how to do wet to dry dressing changes by bedside nurse before discharge.  Home VAC and supplies  will be delivered to patient's hospital room prior to discharge. Patient will take home with her at discharge.   [facility]-18 [facility] will  apply home VAC and begin dressing changes three times a week.  Patient aware of above and voiced understanding and states husband can help with dressing changes. Eber [facility]olyn [facility] aware and bedside nurse aware.              Discussed home VAC with patient at bedside. NCM will fax application to [employer], [employer] will [facility] and supplies to patient's room and directly and  discussed any co pays etc patient maybe responsible for.  Patient voiced understanding.   Provided choice for home health RN. Patient has no preference.   Confirmed face sheet information with patient.Patient does NOT have PCP ( confirmed with patient). Explained home health agency will need PCP in case any medical issues arise. Patient can call number on BC[employer] card and be provided with a list of MD's in network and chose a PCP and call to make appointment. Patient voiced understanding and stated she will call and arrange a PCP.  Home Health agencies NCM called:   [facility] could accept referral but start of care date would be Wednesday 02/20/18.  [facility] , [facility], and [facility] are not in network with [employer] .  [facility], [facility], [facility], , [facility], [facility], [facility] , [facility] do not have staffing.  Barnetta ChapelKelly Osborne PA CCS aware of above and will discuss with Dr Cliffton AstersWhite. Eber JonesCarolyn with [facility] saving [facility] for patient on 02/20/18.  Once wound care confirmed will call [facility] back. Faxed [employer] V[facility] application to [employer] phone [phone], fax [phone]   Action/Plan:   Expected Discharge Date:                  Expected Discharge Plan:  Home w Home Health Services  In-House Referral:     Discharge planning Services  CM Consult  Post Acute Care Choice:  Durable Medical Equipment, Home Health Choice offered to:  Patient  DME Arranged:  Negative pressure wound device DME Agency:  [employer]  HH Arranged:  RN HH Agency:     Status of Service:  In process, will continue to follow  If discussed at Long Length of Stay Meetings, dates discussed:    Additional Comments:  Kingsley PlanWile, Suvan Stcyr Marie, RN 02/15/2018, 10:23 AM

## 2018-02-15 NOTE — Progress Notes (Addendum)
Patient ID: Grace Todd, female   DOB: Apr 17, 1976, 42 y.o.   MRN: [id number]    1 Day Post-Op  Subjective: Patient feeling well this morning.  Stomach is "growling and I feel like I need to fart."  Up in a chair already  Objective: Vital signs in last 24 hours: Temp:  [97.3 F (36.3 C)-98.4 F (36.9 C)] 98 F (36.7 C) (03/08 0450) Pulse Rate:  [57-81] 63 (03/08 0450) Resp:  [10-20] 16 (03/08 0450) BP: (105-159)/(44-95) 105/58 (03/08 0450) SpO2:  [97 %-100 %] 98 % (03/08 0450) Weight:  [257 lb (116.6 kg)] 257 lb (116.6 kg) (03/07 1559) Last BM Date: 02/14/18  Intake/Output from previous day: 03/07 0701 - 03/08 0700 In: 2090 [P.O.:90; I.V.:2000] Out: 875 [Urine:800; Drains:25; Blood:50] Intake/Output this shift: No intake/output data recorded.  PE: Abd: soft, obese, wound VAC in place with some serosang drainage.  +BS.  Wound measures approximately 9x5x5cm in size  Lab Results:  Recent Labs    02/14/18 1600 02/15/18 0615  WBC 20.9* 18.8*  HGB 13.0 11.7*  HCT 39.1 35.6*  PLT 350 315   BMET Recent Labs    02/14/18 1600 02/15/18 0615  NA 137 136  K 4.0 4.3  CL 105 106  CO2 23 22  GLUCOSE 116* 130*  BUN 13 8  CREATININE 0.80 0.68  CALCIUM 9.0 8.0*   PT/INR No results for input(s): LABPROT, INR in the last 72 hours. CMP     Component Value Date/Time   NA 136 02/15/2018 0615   K 4.3 02/15/2018 0615   CL 106 02/15/2018 0615   CO2 22 02/15/2018 0615   GLUCOSE 130 (H) 02/15/2018 0615   BUN 8 02/15/2018 0615   CREATININE 0.68 02/15/2018 0615   CALCIUM 8.0 (L) 02/15/2018 0615   PROT 6.1 (L) 11/24/2016 0019   ALBUMIN 3.5 11/24/2016 0019   AST 20 11/24/2016 0019   ALT 21 11/24/2016 0019   ALKPHOS 52 11/24/2016 0019   BILITOT 0.3 11/24/2016 0019   GFRNONAA >60 02/15/2018 0615   GFRAA >60 02/15/2018 0615   Lipase     Component Value Date/Time   LIPASE 31 11/24/2016 0019       Studies/Results: Ct Abdomen Pelvis W Contrast  Result Date:  02/14/2018 CLINICAL DATA:  Incarcerated umbilical hernia since noon today. EXAM: CT ABDOMEN AND PELVIS WITH CONTRAST TECHNIQUE: Multidetector CT imaging of the abdomen and pelvis was performed using the standard protocol following bolus administration of intravenous contrast. CONTRAST:  ISOVUE-300 IOPAMIDOL (ISOVUE-300) INJECTION 61% COMPARISON:  None. FINDINGS: Lower chest: No acute abnormality. Hepatobiliary: No focal liver abnormality is seen. No gallstones, gallbladder wall thickening, or biliary dilatation. Pancreas: Unremarkable. No pancreatic ductal dilatation or surrounding inflammatory changes. Spleen: Normal in size without focal abnormality. Adrenals/Urinary Tract: Stable interpolar right renal simple cyst measuring up to 2.6 cm. No nephrolithiasis. Normal adrenal glands. Normal bladder. Stomach/Bowel: Physiologic distention of the stomach with normal small bowel rotation. Herniation of a short segment bowel is now noted in addition to omental fat within a previously described supraumbilical ventral hernia. The mouth of the hernia currently measures 3.8 cm transverse by 2.6 cm craniocaudad. The hernia sac measures approximately 9.2 x 6.8 x 7.3 cm. No proximal dilatation of small bowel leading into this hernia is noted. There is fluid-filled distention of the short segment of bowel seen within this hernia however. Trace amount of fluid is also noted within this hernia sac as before. The colon is unremarkable. No pericecal inflammation. The appendix is  not clearly identified. Vascular/Lymphatic: No significant vascular findings are present. No enlarged abdominal or pelvic lymph nodes. Reproductive: Uterus and bilateral adnexa are unremarkable. Fallopian tube clips are seen bilaterally. Other: No free air.  No ascites. 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 IMPRESSION: 1. Redemonstration of a  supraumbilical hernia now measuring 9.2 x 6.8 x 7.3 cm, containing omental fat, trace fluid and now a short segment of small bowel. The segment that has herniated into this hernia is distended with fluid. Findings raise concern for incarcerated bowel within the hernia. No proximal upstream small bowel dilatation is noted leading into this hernia however. 2. Stable right interpolar renal cyst measuring 2.6 cm. Electronically Signed   By: Tollie Ethavid  Kwon M.D.   On: 02/14/2018 17:56    Anti-infectives: Anti-infectives (From admission, onward)   Start     Dose/Rate Route Frequency Ordered Stop   02/14/18 2000  ceFAZolin (ANCEF) IVPB 2g/100 mL premix  Status:  Discontinued     2 g 200 mL/hr over 30 Minutes Intravenous Every 8 hours 02/14/18 1859 02/14/18 2334       Assessment/Plan  1. Incarcerated ventral hernia, POD 1, s/p open primary repair of incarcerated ventral hernia with omentectomy and application of wound VAC  -adv to clear liquids and then adv as tolerates.  If she is doing well will plan for DC home likely tomorrow. -[facility], working on getting this approved today  -mobilize TID -pulm toilet -follow up with Dr. Andrey CampanileWilson in 2-3 weeks  FEN - clears, adv to regular VTE - Lovenox ID - none  Dispo: home tomorrow with VAC if doing well   LOS: 1 day    Letha CapeKelly E Elma Limas , [facility] 02/15/2018, 9:41 AM Pager: [phone] Consults: [phone] Mon-Fri 7:00 am-4:30 pm Sat-Sun 7:00 am-11:30 am

## 2018-02-15 NOTE — Discharge Summary (Signed)
Patient ID: Grace Todd [id number] 09/15/1976 42 y.o.  Admit date: 02/14/2018 Discharge date: 02/17/2018  Admitting Diagnosis: Incarcerated ventral hernia  Discharge Diagnosis Patient Active Problem List   Diagnosis Date Noted  . Strangulated ventral hernia 02/14/2018    Consultants none  Reason for Admission: 42 year old female with severe obesity with known supraumbilical hernia comes to the emergency room with acute onset of pain above her umbilicus around noon this afternoon.  This is accompanied by several episodes of nausea and vomiting.  The pain was persistent so she came to the emergency room.  She denies any fevers or chills.  Last bowel movement was this morning.  She has a known supraumbilical hernia.  She came to the emergency room around December 2018 and was found to have an incarcerated supraumbilical hernia.  It was containing fat tissue at that time and was essentially able to be reduced.  She saw us in our clinic in January 2018 where umbilical hernia surgery was discussed and the patient elected to proceed with surgery.  However it is unclear why she did not schedule.  Upon asking her this evening she does not recall coming to see us in our office or talking with Dr. Magnus IvanBlackman.  Procedures Incarcerated ventral hernia repair with omentectomy and primary repair by Dr. Andrey CampanileWilson on 02-14-18  Hospital Course:  The patient was admitted and taken to the OR where she underwent the above procedure.  She tolerated this well.  Her wound was left open and a wound VAC was placed in the OR.  On POD 1, she was started on clear liquids as she was having some bowel function.  Her diet was advanced as tolerated.  [facility], her wound VAC will be removed prior to discharge and sent home with NS WD dressing changes until the following Wednesday.  That will be the first day that [facility] come and replace her VAC, as their services aren't available until that day.  The patient  is agreeable to this.  On POD 2, she was tolerating a diet, mobilizing well, and pain was controlled. She had only tolerating clear liquids at this point.  She was reluctant and fearful to go home.  She had not had a bowel movement.  She was concerned that her husband could not take care of her On POD #3 she was doing much better and wanted to go home.  She was tolerating regular diet.  Passing flatus but no stool despite MiraLAX 2.  Her abdomen was soft with minimal tenderness.  Not distended.  The negative pressure dressing was removed.  The wound was clean and a saline fine-mesh gauze wet-to-dry dressing was placed. She was given a prescription for Norco for pain.  She was told to continue her usual medications.  She was given very careful instructions in diet and activities and return to work.  She will return to see Dr. Andrey CampanileWilson in about 2 weeks. She was stable for DC home.    Allergies as of 02/15/2018      Reactions   Sulfa Antibiotics Hives      Medication List    STOP taking these medications   ondansetron 4 MG disintegrating tablet Commonly known as:  ZOFRAN ODT   oxyCODONE-acetaminophen 5-325 MG tablet Commonly known as:  PERCOCET/ROXICET     TAKE these medications   oxyCODONE 5 MG immediate release tablet Commonly known as:  Oxy IR/ROXICODONE Take 1-2 tablets (5-10 mg total) by mouth every 4 (  four) hours as needed for moderate pain.        Follow-up Information    Gaynelle Adu, MD Follow up on 02/28/2018.   Specialty:  General Surgery Why:  11:45am is your appointment time.  You will need to arrive 30 minutes prior to your appointment for check in and bring your insurance card and photo ID Contact information: [address] [place] [address] [phone]           Signed: Barnetta Chapel, [facility] 02/15/2018, 1:36 PM Pager: [phone] Consults: [phone] Mon-Fri 7:00 am-4:30 pm Sat-Sun 7:00 am-11:30 am   Angelia Mould.  Derrell Lolling, M.D., [facility] General and Minimally invasive Surgery Breast and Colorectal Surgery Office:   [phone] Pager:   [phone]

## 2018-02-16 LAB — CBC
HCT: 33 % — ABNORMAL LOW (ref 36.0–46.0)
HEMOGLOBIN: 10.6 g/dL — AB (ref 12.0–15.0)
MCH: 28.3 pg (ref 26.0–34.0)
MCHC: 32.1 g/dL (ref 30.0–36.0)
MCV: 88 fL (ref 78.0–100.0)
Platelets: 247 10*3/uL (ref 150–400)
RBC: 3.75 MIL/uL — ABNORMAL LOW (ref 3.87–5.11)
RDW: 14.4 % (ref 11.5–15.5)
WBC: 11.4 10*3/uL — AB (ref 4.0–10.5)

## 2018-02-16 LAB — BASIC METABOLIC PANEL
ANION GAP: 9 (ref 5–15)
BUN: 5 mg/dL — AB (ref 6–20)
CHLORIDE: 105 mmol/L (ref 101–111)
CO2: 24 mmol/L (ref 22–32)
Calcium: 8 mg/dL — ABNORMAL LOW (ref 8.9–10.3)
Creatinine, Ser: 0.77 mg/dL (ref 0.44–1.00)
GFR calc Af Amer: >60 mL/min (ref >60)
GLUCOSE: 95 mg/dL (ref 65–99)
Potassium: 3.5 mmol/L (ref 3.5–5.1)
Sodium: 138 mmol/L (ref 135–145)

## 2018-02-16 MED ORDER — POLYETHYLENE GLYCOL 3350 17 G PO PACK
17.0000 g | PACK | Freq: Two times a day (BID) | ORAL | Status: AC
  Administered 2018-02-16 (×2): 17 g via ORAL
  Filled 2018-02-16 (×2): qty 1

## 2018-02-16 MED ORDER — HYDROCODONE-ACETAMINOPHEN 5-325 MG PO TABS
1.0000 | ORAL_TABLET | ORAL | Status: DC | PRN
  Administered 2018-02-16: 1 via ORAL
  Administered 2018-02-16: 2 via ORAL
  Administered 2018-02-16 – 2018-02-17 (×3): 1 via ORAL
  Filled 2018-02-16 (×5): qty 2
  Filled 2018-02-16: qty 1

## 2018-02-16 MED ORDER — PANTOPRAZOLE SODIUM 40 MG PO TBEC
40.0000 mg | DELAYED_RELEASE_TABLET | Freq: Every day | ORAL | Status: DC
  Administered 2018-02-16 – 2018-02-17 (×2): 40 mg via ORAL
  Filled 2018-02-16 (×2): qty 1

## 2018-02-16 NOTE — Progress Notes (Signed)
2 Days Post-Op  Subjective: Stable and alert. Passing flatus but no stool She is apparently still on clear liquids and diet was not advanced Ambulated once yesterday She expresses concern and anxiety about wound care at home with wet-to-dry dressings  Apparently, all arrangements have been made for discharge home with wet-to-dry dressings and resume negative pressure dressings per home health nursing once they can assumed care on March 13.  Objective: Vital signs in last 24 hours: Temp:  [97.4 F (36.3 C)-99.4 F (37.4 C)] 97.4 F (36.3 C) (03/09 0514) Pulse Rate:  [52-67] 52 (03/09 0514) Resp:  [14] 14 (03/09 0514) BP: (105-112)/(58-69) 107/58 (03/09 0514) SpO2:  [98 %-100 %] 100 % (03/09 0514) Last BM Date: 02/14/18  Intake/Output from previous day: 03/08 0701 - 03/09 0700 In: 3583.8 [P.O.:1780; I.V.:1803.8] Out: 2190 [Urine:2150; Drains:40] Intake/Output this shift: Total I/O In: 1460 [P.O.:600; I.V.:860] Out: 1590 [Urine:1550; Drains:40]  General appearance: Alert.  Moves slowly in bed.  No obvious distress. Resp: clear to auscultation bilaterally GI: Midline wound was negative pressure dressing looks clean.  Abdomen obese.  Soft.  Bowel sounds active.  Appropriate incisional tenderness.  Not distended.  Lab Results:  Results for orders placed or performed during the hospital encounter of 02/14/18 (from the past 24 hour(s))  CBC     Status: Abnormal   Collection Time: 02/16/18  4:09 AM  Result Value Ref Range   WBC 11.4 (H) 4.0 - 10.5 K/uL   RBC 3.75 (L) 3.87 - 5.11 MIL/uL   Hemoglobin 10.6 (L) 12.0 - 15.0 g/dL   HCT 69.6 (L) 29.5 - 28.4 %   MCV 88.0 78.0 - 100.0 fL   MCH 28.3 26.0 - 34.0 pg   MCHC 32.1 30.0 - 36.0 g/dL   RDW 13.2 44.0 - 10.2 %   Platelets 247 150 - 400 K/uL  Basic metabolic panel     Status: Abnormal   Collection Time: 02/16/18  4:09 AM  Result Value Ref Range   Sodium 138 135 - 145 mmol/L   Potassium 3.5 3.5 - 5.1 mmol/L   Chloride 105 101  - 111 mmol/L   CO2 24 22 - 32 mmol/L   Glucose, Bld 95 65 - 99 mg/dL   BUN 5 (L) 6 - 20 mg/dL   Creatinine, Ser 7.25 0.44 - 1.00 mg/dL   Calcium 8.0 (L) 8.9 - 10.3 mg/dL   GFR calc non Af Amer >60 >60 mL/min   GFR calc Af Amer >60 >60 mL/min   Anion gap 9 5 - 15     Studies/Results: No results found.  Marland Kitchen acetaminophen  650 mg Oral Q6H  . enoxaparin (LOVENOX) injection  40 mg Subcutaneous Q24H  . gabapentin  300 mg Oral BID  . ketorolac  30 mg Intravenous Q8H  . pantoprazole  40 mg Oral Daily  . polyethylene glycol  17 g Oral BID     Assessment/Plan: s/p Procedure(s): LAPAROSCOPY DIAGNOSTIC OPEN PRIMARY REPAIR OF STRANULAGED VENTRAL HERNIA, APPLICATION OF WOUND VAC  Strangulated ventral hernia. POD #2.  Open primary repair with partial omentectomy, application of wound VAC Advance diet to soft. MiraLAX twice a day Transition from parenteral to oral pain medication We agreed that she would plan discharge tomorrow and would discuss that with her husband HH are in for [facility] changes arrangement cannot be started until March 13 Wet-to-dry dressings until then. Prior to discharge, will teach patient and family to do wet-to-dry dressings Follow-up with Dr. Andrey Campanile in 2-3 weeks  -VTE-  Lovenox -obesity -daily tobacco abuse  @PROBHOSP @  LOS: 2 days    Ernestene MentionHaywood M Loralee Weitzman 02/16/2018  . .prob

## 2018-02-17 MED ORDER — POLYETHYLENE GLYCOL 3350 17 G PO PACK
17.0000 g | PACK | Freq: Once | ORAL | Status: AC
  Administered 2018-02-17: 17 g via ORAL
  Filled 2018-02-17: qty 1

## 2018-02-17 MED ORDER — HYDROCODONE-ACETAMINOPHEN 5-325 MG PO TABS: 1.0000 | ORAL_TABLET | ORAL | 0 refills | Status: DC | PRN

## 2018-02-17 NOTE — Progress Notes (Signed)
New orders for care, endorsed to incoming nurse.

## 2018-02-17 NOTE — Progress Notes (Signed)
I edited and cosigned her discharge summary.  I logged onto the NCCSRSwebsite and reviewed her prescription medication history  Sarvesh Meddaugh M. Derrell LollingIngram, M.D., [facility] General and Minimally invasive Surgery Breast and Colorectal Surgery Office:   [phone]

## 2018-02-17 NOTE — Progress Notes (Signed)
Pt was discharged to go home with home health.  Education was given for wound care and dressing change to the pt's husband.  Her husband stated understanding about the instructions.  Discharge instruction, necessary supplies and prescriptions given.

## 2018-02-27 ENCOUNTER — Encounter: Payer: Self-pay | Admitting: Family Medicine

## 2018-02-27 ENCOUNTER — Ambulatory Visit (INDEPENDENT_AMBULATORY_CARE_PROVIDER_SITE_OTHER): Payer: BLUE CROSS/BLUE SHIELD | Admitting: Family Medicine

## 2018-02-27 VITALS — BP 126/78 | HR 67 | Temp 98.2°F | Resp 12 | Ht 67.0 in | Wt 254.4 lb

## 2018-02-27 DIAGNOSIS — E559 Vitamin D deficiency, unspecified: Secondary | ICD-10-CM | POA: Diagnosis not present

## 2018-02-27 DIAGNOSIS — D509 Iron deficiency anemia, unspecified: Secondary | ICD-10-CM | POA: Diagnosis not present

## 2018-02-27 DIAGNOSIS — K625 Hemorrhage of anus and rectum: Secondary | ICD-10-CM

## 2018-02-27 DIAGNOSIS — K436 Other and unspecified ventral hernia with obstruction, without gangrene: Secondary | ICD-10-CM | POA: Diagnosis not present

## 2018-02-27 DIAGNOSIS — R739 Hyperglycemia, unspecified: Secondary | ICD-10-CM | POA: Diagnosis not present

## 2018-02-27 LAB — CBC
HEMATOCRIT: 34.8 % — AB (ref 36.0–46.0)
HEMOGLOBIN: 11.6 g/dL — AB (ref 12.0–15.0)
MCHC: 33.3 g/dL (ref 30.0–36.0)
MCV: 85.4 fl (ref 78.0–100.0)
Platelets: 415 10*3/uL — ABNORMAL HIGH (ref 150.0–400.0)
RBC: 4.08 Mil/uL (ref 3.87–5.11)
RDW: 13.8 % (ref 11.5–15.5)
WBC: 11.2 10*3/uL — AB (ref 4.0–10.5)

## 2018-02-27 LAB — HEMOGLOBIN A1C: HEMOGLOBIN A1C: 5.5 % (ref 4.6–6.5)

## 2018-02-27 NOTE — Progress Notes (Signed)
HPI:   Grace Todd is a 42 y.o. female, who is here today to establish care.  Former PCP: N/A Last preventive routine visit: 01/2017 she has no gynecologic routine examination.  Chronic medical problems: Urine incontinence and vitamin D deficiency.  She is now on vitamin D supplementation, she completed Ergocalciferol 50,000 units weekly for a few weeks.  According to patient, she was supposed to follow with her gynecologist but she did not.  She was recently discharged from the hospital, on 02/14/18 she underwent urgent strangulated ventral hernia repair. She was discharged on 02/17/2018. She has open abdominal wound, treatment with VAC, appointment with surgeon tomorrow.  Wound care through [facility] .  Concerns today: She would like to have vitamin D checked today.    Anemia:  Lab Results  Component Value Date   WBC 11.4 (H) 02/16/2018   HGB 10.6 (L) 02/16/2018   HCT 33.0 (L) 02/16/2018   MCV 88.0 02/16/2018   PLT 247 02/16/2018   She is not on iron supplementation.  Noted GLU 120-130 during hospitalization, no history of diabetes.   Intermittent blood on tissue after defecation, this has been going on for about 6 months.  According to patient, she was already evaluated and colonoscopy was recommended. + Smoker.   She denies worsening abdominal pain, which is exacerbated by movement and "eating too much." + Passing flatus. Tolerating oral intake.  She has had some constipation.  She is currently on Oxycodone 5 mg 1-2 tablets every 4 hours as needed for pain. Occasional mild nausea, for which she was prescribed Zofran.  She denies fever, chills, vomiting, or urinary symptoms.  Review of Systems  Constitutional: Positive for fatigue. Negative for appetite change and fever.  HENT: Negative for mouth sores, nosebleeds and trouble swallowing.   Eyes: Negative for redness and visual disturbance.  Respiratory: Negative for cough, shortness of breath and  wheezing.   Cardiovascular: Negative for chest pain, palpitations and leg swelling.  Gastrointestinal: Positive for abdominal pain, anal bleeding and constipation. Negative for nausea and vomiting.       Negative for changes in bowel habits.  Endocrine: Negative for cold intolerance and heat intolerance.  Genitourinary: Negative for decreased urine volume, dysuria and hematuria.  Musculoskeletal: Negative for gait problem and myalgias.  Skin: Positive for wound. Negative for rash.  Neurological: Negative for syncope, weakness and headaches.  Psychiatric/Behavioral: Negative for confusion. The patient is nervous/anxious.       Current Outpatient Medications on File Prior to Visit  Medication Sig Dispense Refill  . HYDROcodone-acetaminophen (NORCO/VICODIN) 5-325 MG tablet Take 1-2 tablets by mouth every 4 (four) hours as needed for moderate pain. 30 tablet 0  . ondansetron (ZOFRAN-ODT) 4 MG disintegrating tablet   0   No current facility-administered medications on file prior to visit.      Past Medical History:  Diagnosis Date  . Abdominal hernia   . Blood in stool    some spotting  . Hx: UTI (urinary tract infection)   . Obesity   . Urine incontinence    Allergies  Allergen Reactions  . Sulfa Antibiotics Hives    Family History  Problem Relation Age of Onset  . Alcohol abuse Mother   . Cancer Mother   . Hyperlipidemia Mother   . Stroke Mother   . Hyperlipidemia Father   . Heart disease Father   . Hyperlipidemia Sister   . Depression Sister     Social History   Socioeconomic History  .  Marital status: Married    Spouse name: Not on file  . Number of children: 4  . Years of education: Not on file  . Highest education level: Not on file  Occupational History  . Not on file  Social Needs  . Financial resource strain: Not on file  . Food insecurity:    Worry: Not on file    Inability: Not on file  . Transportation needs:    Medical: Not on file     Non-medical: Not on file  Tobacco Use  . Smoking status: Current Every Day Smoker    Types: Cigarettes  . Smokeless tobacco: Never Used  Substance and Sexual Activity  . Alcohol use: No  . Drug use: No  . Sexual activity: Yes    Partners: Male  Lifestyle  . Physical activity:    Days per week: Not on file    Minutes per session: Not on file  . Stress: Not on file  Relationships  . Social connections:    Talks on phone: Not on file    Gets together: Not on file    Attends religious service: Not on file    Active member of club or organization: Not on file    Attends meetings of clubs or organizations: Not on file    Relationship status: Not on file  Other Topics Concern  . Not on file  Social History Narrative  . Not on file    Vitals:   02/27/18 1432  BP: 126/78  Pulse: 67  Resp: 12  Temp: 98.2 F (36.8 C)  SpO2: 96%    Body mass index is 39.84 kg/m.   Physical Exam  Nursing note and vitals reviewed. Constitutional: She is oriented to person, place, and time. She appears well-developed. No distress.  HENT:  Head: Normocephalic and atraumatic.  Mouth/Throat: Oropharynx is clear and moist and mucous membranes are normal.  Eyes: Conjunctivae and EOM are normal. Pupils are equal, round, and reactive to light.  Cardiovascular: Normal rate and regular rhythm.  No murmur heard. Pulses:      Dorsalis pedis pulses are 2+ on the right side, and 2+ on the left side.  Respiratory: Effort normal and breath sounds normal. No respiratory distress.  GI: Soft. She exhibits no mass. There is no hepatomegaly. There is no tenderness.  Musculoskeletal: She exhibits no edema.  Lymphadenopathy:    She has no cervical adenopathy.  Neurological: She is alert and oriented to person, place, and time. She has normal strength. Gait normal.  Skin: Skin is warm. No rash noted. No erythema.  On abdomen she has a bandage covering wound, there is not drainage on gauze.  Psychiatric: She  has a normal mood and affect.  Well groomed, good eye contact.      ASSESSMENT AND PLAN:   Ms. Grace Todd was seen today for establish care.  Diagnoses and all orders for this visit:  Lab Results  Component Value Date   WBC 11.2 (H) 02/27/2018   HGB 11.6 (L) 02/27/2018   HCT 34.8 (L) 02/27/2018   MCV 85.4 02/27/2018   PLT 415.0 (H) 02/27/2018   Lab Results  Component Value Date   HGBA1C 5.5 02/27/2018    Iron deficiency anemia, unspecified iron deficiency anemia type  Recommendation will be given according to CBC.  -     CBC  Vitamin D deficiency, unspecified  Further recommendations will be given according to vitamin D 25 OH level. For now she can continue OTC vitamin  D supplementation 1000 U daily.  -     VITAMIN D 25 Hydroxy (Vit-D Deficiency, Fractures)  Hyperglycemia  Primary prevention through a healthy lifestyle recommended for now. Further recommendation according to lab results.  -     Hemoglobin A1c  Rectal bleeding  She will let me know she needs a referral to see GI, she is already established with one. Avoid constipation,some side effects of opioid meds discussed.  She prefers to wait on colonoscopy until abdominal wound heals, which I think is appropriate. Instructed about warning signs.   Strangulated ventral hernia  S/P surgical repair with open abdominal wall wound and wound VAC. She is having wound care through [facility]. Encouraged smoking cessation.  She will keep appointment with surgeon tomorrow.      I will see her back in 5 months for her CPE, before if needed.        Raelynne Ludwick G. [place], MD  [facility]. [place] office.

## 2018-02-27 NOTE — Patient Instructions (Signed)
A few things to remember from today's visit:   Iron deficiency anemia, unspecified iron deficiency anemia type - Plan: CBC  Vitamin D deficiency, unspecified - Plan: VITAMIN D 25 Hydroxy (Vit-D Deficiency, Fractures)  Hyperglycemia - Plan: Hemoglobin A1c  Continue following with surgeon for issues related to abdominal wound.   We have ordered labs or studies at this visit.  It can take up to 1-2 weeks for results and processing. IF results require follow up or explanation, we will call you with instructions. Clinically stable results will be released to your [facility]. If you have not heard from us or cannot find your results in [facility] in 2 weeks please contact our office at [phone].  If you are not yet signed up for [facility], please consider signing up  Please be sure medication list is accurate. If a new problem present, please set up appointment sooner than planned today.

## 2018-02-28 LAB — VITAMIN D 25 HYDROXY (VIT D DEFICIENCY, FRACTURES): VITD: 11.21 ng/mL — ABNORMAL LOW (ref 30.00–100.00)

## 2018-03-15 ENCOUNTER — Telehealth: Payer: Self-pay | Admitting: Family Medicine

## 2018-03-15 NOTE — Telephone Encounter (Signed)
Copied from CRM [id number]. Topic: Quick Communication - Rx Refill/Question >> Mar 15, 2018  4:43 PM Landry MellowFoltz, Melissa J wrote: Medication: vitamin d 50,000 Has the patient contacted their pharmacy? Yes.   (Agent: If no, request that the patient contact the pharmacy for the refill.)[phone] Preferred Pharmacy (with phone number or street name): [facility]  Agent: Please be advised that RX refills may take up to 3 business days. We ask that you follow-up with your pharmacy.

## 2018-03-18 ENCOUNTER — Other Ambulatory Visit: Payer: Self-pay | Admitting: *Deleted

## 2018-03-18 MED ORDER — VITAMIN D (ERGOCALCIFEROL) 1.25 MG (50000 UNIT) PO CAPS: 50000.0000 [IU] | ORAL_CAPSULE | ORAL | 0 refills | Status: AC

## 2018-03-18 NOTE — Telephone Encounter (Signed)
Vitamin D 50,000 refill request  (Not see this med on her med list) - however Dr. SwazilandJordan mentions this in her notes.  LOV 02/27/18 with Dr. SwazilandJordan  [facility]  [phone]

## 2018-03-18 NOTE — Telephone Encounter (Signed)
Rx sent to pharmacy   

## 2018-04-15 ENCOUNTER — Ambulatory Visit: Payer: Self-pay | Admitting: Surgery

## 2018-07-02 NOTE — Progress Notes (Signed)
Spoke with patient about procedure and was told she needed to reschedule, but couldn't get through to Dr. Lucilla LameWhite's office.  Called [facility] and spoke to [facility] about rescheduling patient's procedure.

## 2018-07-04 ENCOUNTER — Encounter (HOSPITAL_COMMUNITY): Admission: RE | Payer: Self-pay | Source: Ambulatory Visit

## 2018-07-04 ENCOUNTER — Ambulatory Visit (HOSPITAL_COMMUNITY): Admission: RE | Admit: 2018-07-04 | Payer: BLUE CROSS/BLUE SHIELD | Source: Ambulatory Visit | Admitting: Surgery

## 2018-07-04 SURGERY — COLONOSCOPY
Anesthesia: Monitor Anesthesia Care

## 2018-07-28 NOTE — Progress Notes (Deleted)
HPI:   Grace Todd Grace Todd is a 42 y.o. female, who is here today for her routine physical.  Last CPE: ***  Regular exercise 3 or more time per week: *** Following a healthy diet: *** She lives with ***  Chronic medical problems: Vit D def and iron def anemia.  Pap smear **** Hx of abnormal pap smears: *** Hx of STD's ***   There is no immunization history on file for this patient.  Mammogram: *** Colonoscopy: *** DEXA: ***  Hep C screening (if born 831945-1966): ***  She has *** concerns today.   Lab Results  Component Value Date   WBC 11.2 (H) 02/27/2018   HGB 11.6 (L) 02/27/2018   HCT 34.8 (L) 02/27/2018   MCV 85.4 02/27/2018   PLT 415.0 (H) 02/27/2018     Review of Systems    Current Outpatient Medications on File Prior to Visit  Medication Sig Dispense Refill  . DUEXIS 800-26.6 MG TABS Take 1 tablet by mouth daily as needed (pain).   2  . HYDROcodone-acetaminophen (NORCO/VICODIN) 5-325 MG tablet Take 1-2 tablets by mouth every 4 (four) hours as needed for moderate pain. (Patient not taking: Reported on 05/31/2018) 30 tablet 0   No current facility-administered medications on file prior to visit.      Past Medical History:  Diagnosis Date  . Abdominal hernia   . Blood in stool    some spotting  . Hx: UTI (urinary tract infection)   . Obesity   . Urine incontinence     Past Surgical History:  Procedure Laterality Date  . HERNIA REPAIR     starngulated ventral hernia  . LAPAROSCOPY N/A 02/14/2018   Procedure: LAPAROSCOPY DIAGNOSTIC;  Surgeon: Gaynelle AduWilson, Eric, MD;  [facility];  Service: General;  Laterality: N/A;  opened at 2039  . TUBAL LIGATION  12/2005  . UMBILICAL HERNIA REPAIR N/A 02/14/2018   Procedure: OPEN PRIMARY REPAIR OF STRANULAGED VENTRAL HERNIA, APPLICATION OF WOUND VAC;  Surgeon: Gaynelle AduWilson, Eric, MD;  [facility];  Service: General;  Laterality: N/A;  opened at 2039    Allergies  Allergen Reactions  . Sulfa  Antibiotics Hives    Family History  Problem Relation Age of Onset  . Alcohol abuse Mother   . Cancer Mother   . Hyperlipidemia Mother   . Stroke Mother   . Hyperlipidemia Father   . Heart disease Father   . Hyperlipidemia Sister   . Depression Sister     Social History   Socioeconomic History  . Marital status: Married    Spouse name: Not on file  . Number of children: 4  . Years of education: Not on file  . Highest education level: Not on file  Occupational History  . Not on file  Social Needs  . Financial resource strain: Not on file  . Food insecurity:    Worry: Not on file    Inability: Not on file  . Transportation needs:    Medical: Not on file    Non-medical: Not on file  Tobacco Use  . Smoking status: Current Every Day Smoker    Types: Cigarettes  . Smokeless tobacco: Never Used  Substance and Sexual Activity  . Alcohol use: No  . Drug use: No  . Sexual activity: Yes    Partners: Male  Lifestyle  . Physical activity:    Days per week: Not on file    Minutes per session: Not on file  .  Stress: Not on file  Relationships  . Social connections:    Talks on phone: Not on file    Gets together: Not on file    Attends religious service: Not on file    Active member of club or organization: Not on file    Attends meetings of clubs or organizations: Not on file    Relationship status: Not on file  Other Topics Concern  . Not on file  Social History Narrative  . Not on file     There were no vitals filed for this visit. There is no height or weight on file to calculate BMI.   Wt Readings from Last 3 Encounters:  02/27/18 254 lb 6 oz (115.4 kg)  02/14/18 257 lb (116.6 kg)  10/02/14 258 lb 5 oz (117.2 kg)      Physical Exam    ASSESSMENT AND PLAN:  Grace Todd was here today annual physical examination.     No orders of the defined types were placed in this encounter.   There are no diagnoses linked to this  encounter.    No problem-specific Assessment & Plan notes found for this encounter.            No follow-ups on file.          Grace Antonio G. [place]Jordan, MD  [facility]. [place] office.

## 2018-07-29 ENCOUNTER — Ambulatory Visit: Payer: BLUE CROSS/BLUE SHIELD | Admitting: Family Medicine

## 2018-07-29 DIAGNOSIS — Z0289 Encounter for other administrative examinations: Secondary | ICD-10-CM

## 2018-09-08 IMAGING — CT CT ABD-PELV W/ CM
2 of 5 series · 16 of 46 positions shown, 18 images · IV contrast (Omni 300)
Comparison: None.

CLINICAL DATA: Incarcerated umbilical hernia since noon today.

EXAM:
CT ABDOMEN AND PELVIS WITH CONTRAST
TECHNIQUE: Multidetector CT imaging of the abdomen and pelvis was performed
using the standard protocol following bolus administration of
intravenous contrast.
CONTRAST:  100mL G660NO-TOO IOPAMIDOL (G660NO-TOO) INJECTION 61%

[Series 3: a/p w/ 5mm · axial · 0.80mm/px · z∈[-461,-56]mm · 13 of 93 slices shown, 15 images]
[im 6/93  soft-tissue]
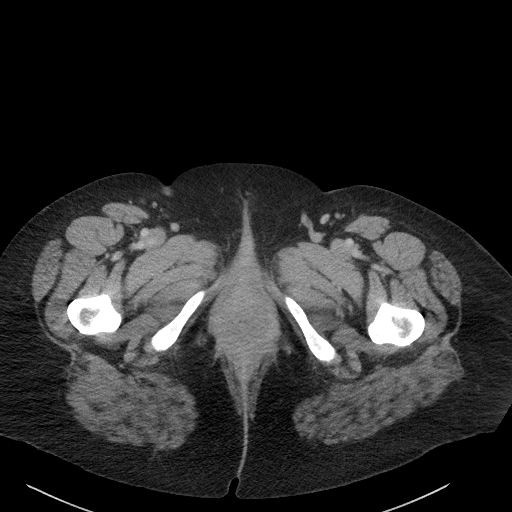
[im 6/93  bone]
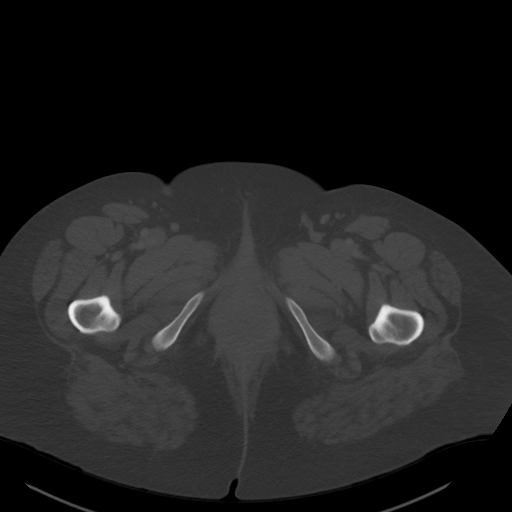
[im 11/93  soft-tissue]
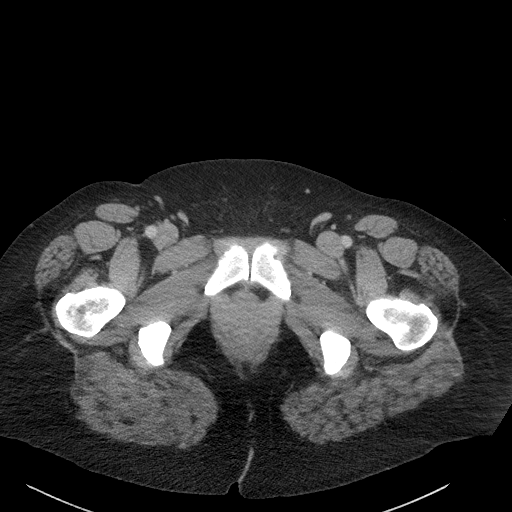
[im 22/93  soft-tissue]
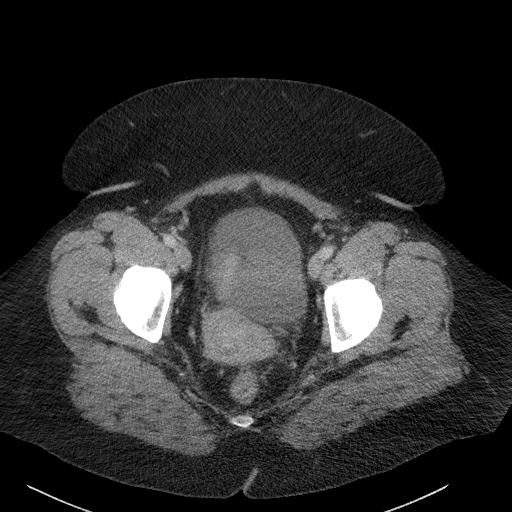
[im 28/93  soft-tissue]
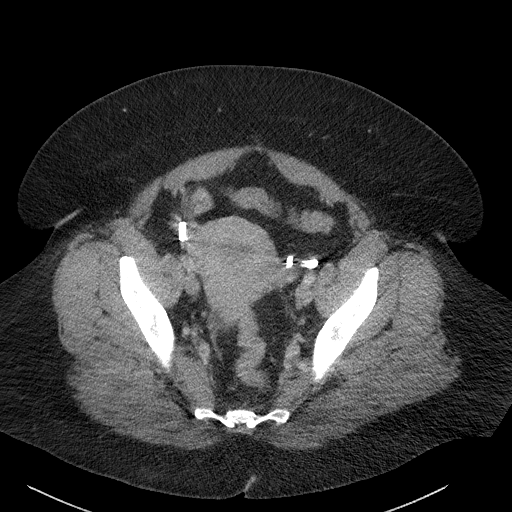
[im 33/93  soft-tissue]
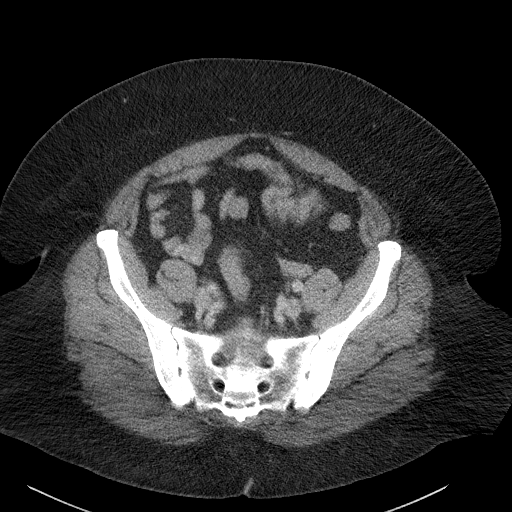
[im 38/93  soft-tissue]
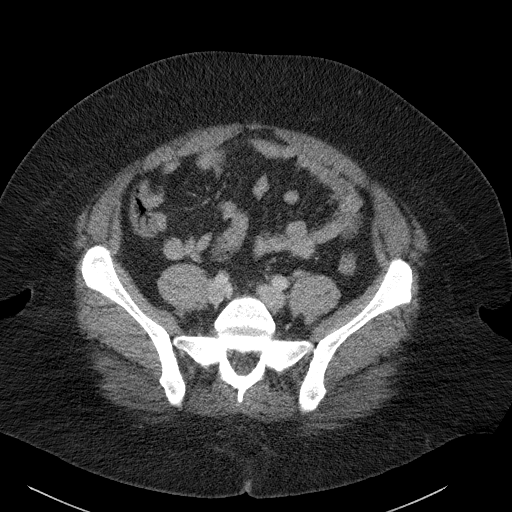
[im 49/93  soft-tissue]
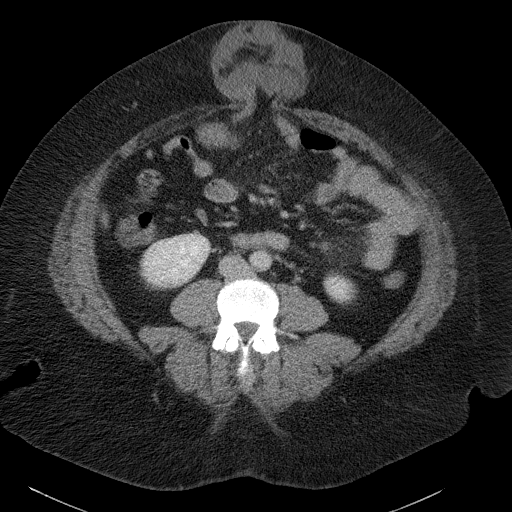
[im 55/93  soft-tissue]
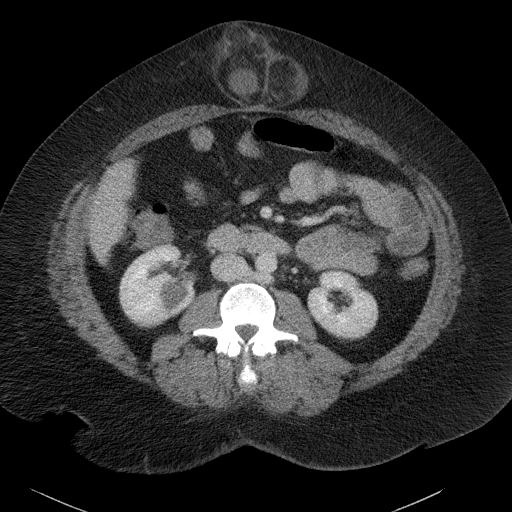
[im 60/93  soft-tissue]
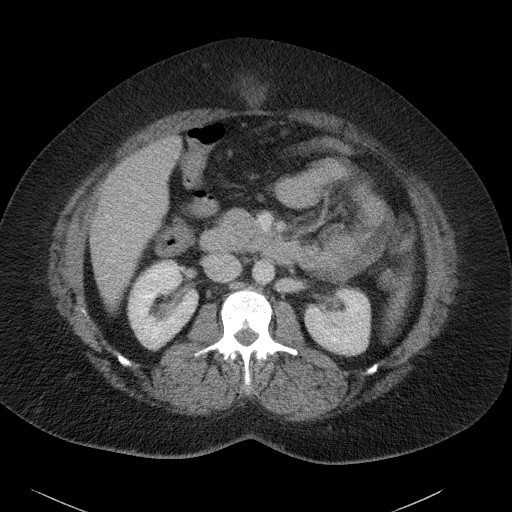
[im 60/93  bone]
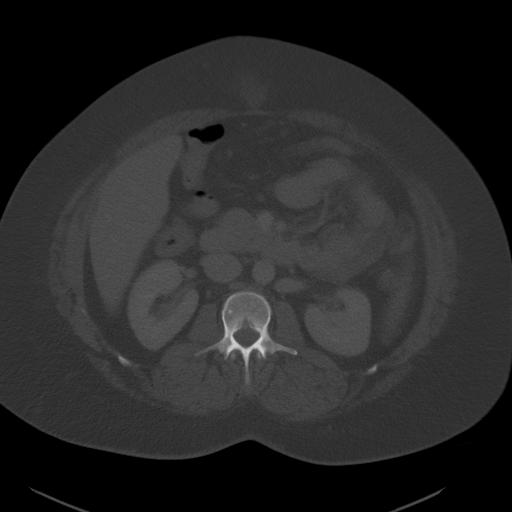
[im 65/93  soft-tissue]
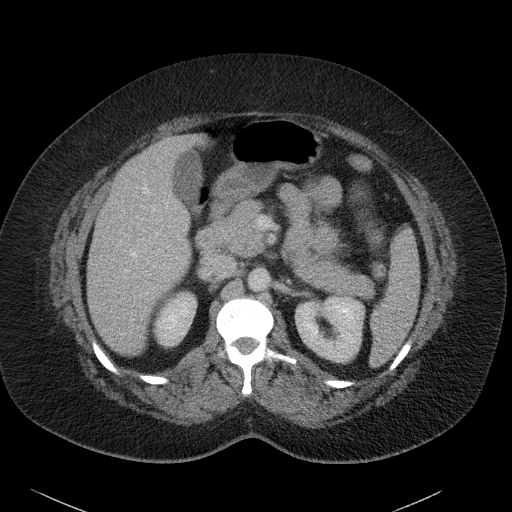
[im 71/93  soft-tissue]
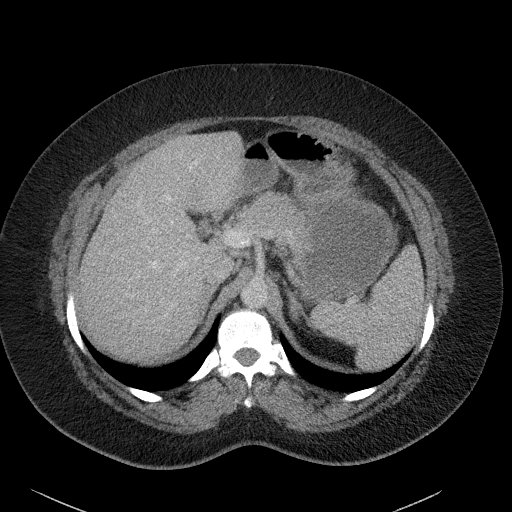
[im 82/93  soft-tissue]
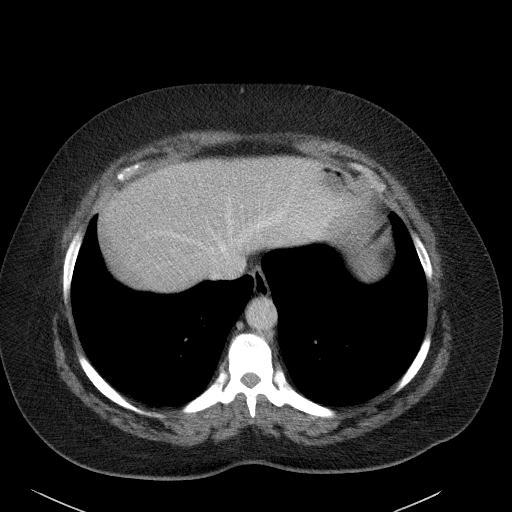
[im 87/93  soft-tissue]
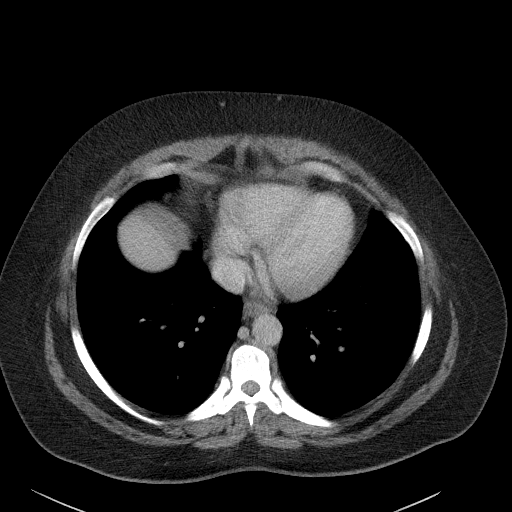

[Series 6: a/p w/ cor · coronal · 0.92mm/px · 3 of 203 slices shown]
[im 68/203  soft-tissue]
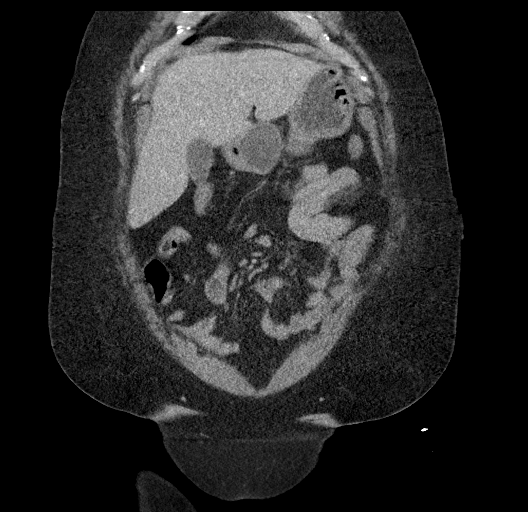
[im 90/203  soft-tissue]
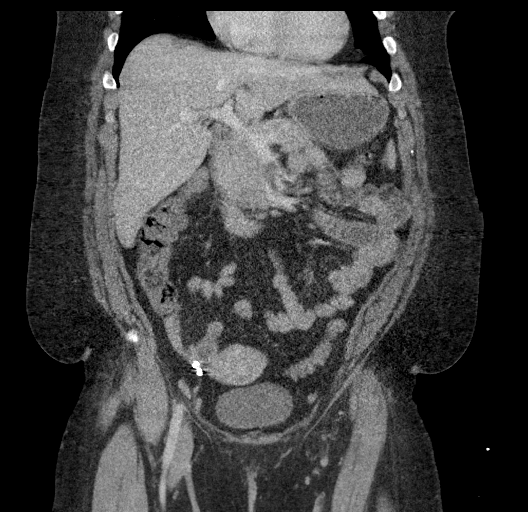
[im 113/203  soft-tissue]
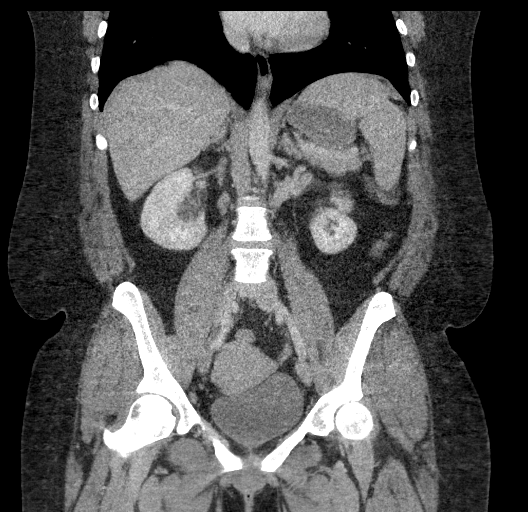

[16 of 46 positions shown; findings below may reference images not displayed]

FINDINGS: Lower chest: No acute abnormality.

Hepatobiliary: No focal liver abnormality is seen. No gallstones,
gallbladder wall thickening, or biliary dilatation.

Pancreas: Unremarkable. No pancreatic ductal dilatation or
surrounding inflammatory changes.

Spleen: Normal in size without focal abnormality.

Adrenals/Urinary Tract: Stable interpolar right renal simple cyst
measuring up to 2.6 cm. No nephrolithiasis. Normal adrenal glands.
Normal bladder.

Stomach/Bowel: Physiologic distention of the stomach with normal
small bowel rotation. Herniation of a short segment bowel is now
noted in addition to omental fat within a previously described
supraumbilical ventral hernia. The mouth of the hernia currently
measures 3.8 cm transverse by 2.6 cm craniocaudad. The hernia sac
measures approximately 9.2 x 6.8 x 7.3 cm. No proximal dilatation of
small bowel leading into this hernia is noted. There is fluid-filled
distention of the short segment of bowel seen within this hernia
however. Trace amount of fluid is also noted within this hernia sac
as before. The colon is unremarkable. No pericecal inflammation. The
appendix is not clearly identified.

Vascular/Lymphatic: No significant vascular findings are present. No
enlarged abdominal or pelvic lymph nodes.

Reproductive: Uterus and bilateral adnexa are unremarkable.
Fallopian tube clips are seen bilaterally.

Other: No free air.  No ascites.

Musculoskeletal: Chronic stable sclerosis along the iliac aspect of
the left SI joint as before, nonspecific but may reflect osteitis
condensans ilii.
IMPRESSION: 1. Redemonstration of a supraumbilical hernia now measuring 9.2 x
6.8 x 7.3 cm, containing omental fat, trace fluid and now a short
segment of small bowel. The segment that has herniated into this
hernia is distended with fluid. Findings raise concern for
incarcerated bowel within the hernia. No proximal upstream small
bowel dilatation is noted leading into this hernia however.
2. Stable right interpolar renal cyst measuring 2.6 cm.

## 2022-03-02 ENCOUNTER — Other Ambulatory Visit: Payer: Self-pay

## 2022-03-02 ENCOUNTER — Encounter: Payer: Self-pay | Admitting: Family Medicine

## 2022-03-02 ENCOUNTER — Ambulatory Visit (INDEPENDENT_AMBULATORY_CARE_PROVIDER_SITE_OTHER): Payer: 59 | Admitting: Family Medicine

## 2022-03-02 VITALS — BP 128/84 | HR 70 | Temp 98.6°F | Ht 67.0 in | Wt 245.4 lb

## 2022-03-02 DIAGNOSIS — R32 Unspecified urinary incontinence: Secondary | ICD-10-CM | POA: Diagnosis not present

## 2022-03-02 DIAGNOSIS — M25471 Effusion, right ankle: Secondary | ICD-10-CM

## 2022-03-02 DIAGNOSIS — M25571 Pain in right ankle and joints of right foot: Secondary | ICD-10-CM

## 2022-03-02 DIAGNOSIS — E669 Obesity, unspecified: Secondary | ICD-10-CM | POA: Diagnosis not present

## 2022-03-02 DIAGNOSIS — Z9889 Other specified postprocedural states: Secondary | ICD-10-CM

## 2022-03-02 DIAGNOSIS — Z8719 Personal history of other diseases of the digestive system: Secondary | ICD-10-CM

## 2022-03-02 DIAGNOSIS — Z1211 Encounter for screening for malignant neoplasm of colon: Secondary | ICD-10-CM

## 2022-03-02 LAB — URINALYSIS, ROUTINE W REFLEX MICROSCOPIC
Bilirubin Urine: NEGATIVE
Hgb urine dipstick: NEGATIVE
Ketones, ur: NEGATIVE
Leukocytes,Ua: NEGATIVE
Nitrite: NEGATIVE
RBC / HPF: NONE SEEN (ref 0–?)
Specific Gravity, Urine: 1.01 (ref 1.000–1.030)
Total Protein, Urine: NEGATIVE
Urine Glucose: NEGATIVE
Urobilinogen, UA: 0.2 (ref 0.0–1.0)
WBC, UA: NONE SEEN (ref 0–?)
pH: 7 (ref 5.0–8.0)

## 2022-03-02 NOTE — Progress Notes (Signed)
Her urine is normal. No sign of infection, blood, protein or glucose. If the urinary issues continue, I recommend following up with her urologist as we discussed since she has seen them for the incontinence in the past. Thanks.

## 2022-03-02 NOTE — Progress Notes (Signed)
? ?Subjective:  ? ? Patient ID: Grace Todd, female    DOB: 12-06-1976, 46 y.o.   MRN: [id number] ? ?HPI ?Chief Complaint  ?Patient presents with  ? Establish Care  ?  From Dr. Betty [place]. Pt has had 2 hernia surguries. After the 2nd surgury pt wiill sometimes have stomach pain and feels an "air bubble" that she can push down. Dr has told her she may have a possible seroma.  ? ?Would also like her right ankle looked at.  ? ?Hernia surgery in 2020 and again in 2021 with mesh. Dr. Selena Batten is her surgeon.  ? ?States she noticed an "air bubble" feeling in the area for the past week. Reports a small bulge above her umbilicus in the region of her scars from previous hernia surgeries. States it comes and goes. She reports having nausea at times. No fever, chills, chest pain, palpitations, changes in bowel habits, or vomiting.  ? ?Complains of urinary incontence for the past 2 months with a history of the same. States she saw urologist for this and was offered to have a procedure. She declined.  ?Denies urinary frequency, urgency, dysuria.  ? ?Drinking plenty of water.  ? ?Right lateral ankle swelling for 2 years. States she had XR which was negative. No change since.  ?Wears a brace at times.  ? ?2 aunts with colon cancer. She has not had a screening colonoscopy.  ? ?Past Medical History:  ?Diagnosis Date  ? Abdominal hernia   ? Blood in stool   ? some spotting  ? Hx: UTI (urinary tract infection)   ? Obesity   ? Urine incontinence   ? ?Current Outpatient Medications on File Prior to Visit  ?Medication Sig Dispense Refill  ? FOLIC ACID PO Take 600 mg by mouth. Takes 2 pills once a day.    ? Misc Natural Products (CRANBERRY/PROBIOTIC PO) Take by mouth. Takes 2 pills once a day.    ? ?No current facility-administered medications on file prior to visit.  ? ? ? ? ? ?Review of Systems ?Pertinent positives and negatives in the history of present illness. ? ?   ?Objective:  ? Physical Exam ?Constitutional:    ?   General: She is not in acute distress. ?   Appearance: She is obese. She is not ill-appearing.  ?Eyes:  ?   Conjunctiva/sclera: Conjunctivae normal.  ?   Pupils: Pupils are equal, round, and reactive to light.  ?Cardiovascular:  ?   Rate and Rhythm: Normal rate and regular rhythm.  ?   Pulses: Normal pulses.  ?Pulmonary:  ?   Effort: Pulmonary effort is normal.  ?   Breath sounds: Normal breath sounds.  ?Abdominal:  ?   General: Bowel sounds are normal. There is no distension.  ?   Palpations: Abdomen is soft. There is no mass.  ?   Tenderness: There is no abdominal tenderness. There is no guarding or rebound.  ?   Hernia: No hernia is present.  ?Musculoskeletal:     ?   General: Normal range of motion.  ?   Right ankle: Swelling present. No tenderness. Normal range of motion. Normal pulse.  ?   Right foot: Normal range of motion and normal capillary refill. Normal pulse.  ?Skin: ?   General: Skin is warm and dry.  ?Neurological:  ?   General: No focal deficit present.  ?   Mental Status: She is alert and oriented to person, place, and time.  ? ?  BP 128/84 (BP Location: Right Arm, Patient Position: Sitting, Cuff Size: Large)   Pulse 70   Temp 98.6 ?F (37 ?C) (Oral)   Ht 5\' 7"  (1.702 m)   Wt 245 lb 6.4 oz (111.3 kg)   LMP 01/30/2022 (Within Days)   SpO2 99%   BMI 38.44 kg/m?  ? ? ? ? ?   ?Assessment & Plan:  ?Pain and swelling of right ankle ?-previous work up with negative Xray per patient. No new injury or change in appearance. Swelling improves with rest. Declines X ray today or referral to sports medicine. She will let me know if she changes her mind.  ? ?History of hernia surgery ?-well healed scars. No obvious hernia on palpation. Benign abdominal exam. Recommend avoiding heavy lifting or straining. Follow up with surgeon as needed.  ? ?Urinary incontinence, unspecified type - Plan: Urinalysis, Routine w reflex microscopic, Urinalysis, Routine w reflex microscopic ?-UA negative. Discussed possible  pelvic floor weakness along with combined urge. Recommend emptying bladder on a regular schedule. Offered PT, declines for now. May follow up with urologist.  ? ?Obesity (BMI 30-39.9) ?-counseling on calorie counting and eating a low carb diet.  ? ?Screen for colon cancer - Plan: Ambulatory referral to Gastroenterology ? ? ?

## 2022-03-02 NOTE — Patient Instructions (Signed)
Go downstairs to the lab and leave a urine specimen.  ? ?Contact your surgeon if your abdomen continues to give you a problem.  ? ? ?

## 2022-03-23 ENCOUNTER — Ambulatory Visit (AMBULATORY_SURGERY_CENTER): Payer: 59 | Admitting: *Deleted

## 2022-03-23 VITALS — Ht 67.0 in | Wt 240.0 lb

## 2022-03-23 DIAGNOSIS — Z1211 Encounter for screening for malignant neoplasm of colon: Secondary | ICD-10-CM

## 2022-03-23 MED ORDER — PEG 3350-KCL-NA BICARB-NACL 420 G PO SOLR: 4000.0000 mL | Freq: Once | ORAL | 0 refills | Status: AC

## 2022-03-23 NOTE — Progress Notes (Signed)
No egg or soy allergy known to patient  ?No issues known to pt with past sedation with any surgeries or procedures ?Patient denies ever being told they had issues or difficulty with intubation  ?No FH of Malignant Hyperthermia ?Pt is not on diet pills ?Pt is not on  home 02  ?Pt is not on blood thinners  ?Pt denies issues with constipation  ?No A fib or A flutter ? ? NO PA's for preps discussed with pt In PV today  ?Discussed with pt there will be an out-of-pocket cost for prep and that varies from $0 to 70 +  dollars - pt verbalized understanding  ?Pt instructed to use Singlecare.com or GoodRx for a price reduction on prep  ? ?PV completed over the phone. Pt verified name, DOB, address and insurance during PV today.  ?Pt mailed instruction packet with copy of consent form to read and not return, and instructions.  ?Pt encouraged to call with questions or issues.  ?If pt has My chart, procedure instructions sent via My Chart  ? ?

## 2022-03-29 ENCOUNTER — Encounter: Payer: Self-pay | Admitting: Gastroenterology

## 2022-04-07 ENCOUNTER — Ambulatory Visit (AMBULATORY_SURGERY_CENTER): Payer: 59 | Admitting: Gastroenterology

## 2022-04-07 ENCOUNTER — Encounter: Payer: Self-pay | Admitting: Gastroenterology

## 2022-04-07 VITALS — BP 148/93 | HR 55 | Temp 97.5°F | Resp 15 | Ht 67.0 in | Wt 240.0 lb

## 2022-04-07 DIAGNOSIS — Z1211 Encounter for screening for malignant neoplasm of colon: Secondary | ICD-10-CM | POA: Diagnosis present

## 2022-04-07 MED ORDER — SODIUM CHLORIDE 0.9 % IV SOLN: 500.0000 mL | Freq: Once | INTRAVENOUS | Status: DC

## 2022-04-07 NOTE — Progress Notes (Signed)
PT taken to PACU. Monitors in place. VSS. Report given to RN. 

## 2022-04-07 NOTE — Progress Notes (Signed)
History and Physical: ? This patient presents for endoscopic testing for: ?Encounter Diagnosis  ?Name Primary?  ? Special screening for malignant neoplasms, colon Yes  ? ? ?Average risk - first screening exam ?Patient denies chronic abdominal pain, rectal bleeding, constipation or diarrhea. ? ? ?Patient is otherwise without complaints or active issues today. ? ? ?Past Medical History: ?Past Medical History:  ?Diagnosis Date  ? Abdominal hernia   ? Blood in stool   ? some spotting when she has her periods  ? Hx: UTI (urinary tract infection)   ? Obesity   ? Urine incontinence   ? ? ? ?Past Surgical History: ?Past Surgical History:  ?Procedure Laterality Date  ? APPENDECTOMY    ? HERNIA REPAIR    ? starngulated ventral hernia  ? LAPAROSCOPY N/A 02/14/2018  ? Procedure: LAPAROSCOPY DIAGNOSTIC;  Surgeon: Gaynelle Adu, MD;  Location: [facility] OR;  Service: General;  Laterality: N/A;  opened at 2039  ? TUBAL LIGATION  12/2005  ? UMBILICAL HERNIA REPAIR N/A 02/14/2018  ? Procedure: OPEN PRIMARY REPAIR OF STRANULAGED VENTRAL HERNIA, APPLICATION OF WOUND VAC;  Surgeon: Gaynelle Adu, MD;  Location: [facility] OR;  Service: General;  Laterality: N/A;  opened at 2039  ? ? ?Allergies: ?Allergies  ?Allergen Reactions  ? Ciprofloxacin Rash  ? Sulfa Antibiotics Hives and Rash  ? ? ?Outpatient Meds: ?Current Outpatient Medications  ?Medication Sig Dispense Refill  ? Misc Natural Products (CRANBERRY/PROBIOTIC PO) Take by mouth. Takes 2 pills once a day.    ? ?Current Facility-Administered Medications  ?Medication Dose Route Frequency Provider Last Rate Last Admin  ? 0.9 %  sodium chloride infusion  500 mL Intravenous Once Sherrilyn Rist, MD      ? ? ? ? ?___________________________________________________________________ ?Objective  ? ?Exam: ? ?BP 131/75   Pulse 66   Temp (!) 97.5 ?F (36.4 ?C) (Temporal)   Ht 5\' 7"  (1.702 m)   Wt 240 lb (108.9 kg)   LMP 03/10/2022   SpO2 99%   BMI 37.59 kg/m?  ? ?CV: RRR without murmur, S1/S2 ?Resp:  clear to auscultation bilaterally, normal RR and effort noted ?GI: soft, no tenderness, with active bowel sounds. ? ? ?Assessment: ?Encounter Diagnosis  ?Name Primary?  ? Special screening for malignant neoplasms, colon Yes  ? ? ? ?Plan: ?Colonoscopy ? The benefits and risks of the planned procedure were described in detail with the patient or (when appropriate) their health care proxy.  Risks were outlined as including, but not limited to, bleeding, infection, perforation, adverse medication reaction leading to cardiac or pulmonary decompensation, pancreatitis (if ERCP).  The limitation of incomplete mucosal visualization was also discussed.  No guarantees or warranties were given. ? ? ? ?The patient is appropriate for an endoscopic procedure in the ambulatory setting. ? ? - 03/12/2022, MD ? ? ? ? ?

## 2022-04-07 NOTE — Op Note (Signed)
[facility] ?Patient Name: Grace Todd ?Procedure Date: 04/07/2022 3:54 PM ?MRN: [id number] ?Endoscopist: Starr Lake. Myrtie Neither , MD ?Age: 46 ?Referring MD:  ?Date of Birth: 1976/09/30 ?Gender: Female ?Account #: [id number] ?Procedure:                Colonoscopy ?Indications:              Screening for colorectal malignant neoplasm, This  ?                          is the patient's first colonoscopy ?Medicines:                Monitored Anesthesia Care ?Procedure:                Pre-Anesthesia Assessment: ?                          - Prior to the procedure, a History and Physical  ?                          was performed, and patient medications and  ?                          allergies were reviewed. The patient's tolerance of  ?                          previous anesthesia was also reviewed. The risks  ?                          and benefits of the procedure and the sedation  ?                          options and risks were discussed with the patient.  ?                          All questions were answered, and informed consent  ?                          was obtained. Prior Anticoagulants: The patient has  ?                          taken no previous anticoagulant or antiplatelet  ?                          agents. ASA Grade Assessment: II - A patient with  ?                          mild systemic disease. After reviewing the risks  ?                          and benefits, the patient was deemed in  ?                          satisfactory condition to undergo the procedure. ?  After obtaining informed consent, the colonoscope  ?                          was passed under direct vision. Throughout the  ?                          procedure, the patient's blood pressure, pulse, and  ?                          oxygen saturations were monitored continuously. The  ?                          CF [id number] #[id number] was introduced through the anus  ?                          and advanced to the the  cecum, identified by  ?                          appendiceal orifice and ileocecal valve. The  ?                          colonoscopy was performed without difficulty. The  ?                          patient tolerated the procedure well. The quality  ?                          of the bowel preparation was excellent. The  ?                          ileocecal valve, appendiceal orifice, and rectum  ?                          were photographed. ?Scope In: 4:09:00 PM ?Scope Out: 4:20:53 PM ?Scope Withdrawal Time: 0 hours 9 minutes 48 seconds  ?Total Procedure Duration: 0 hours 11 minutes 53 seconds  ?Findings:                 The perianal and digital rectal examinations were  ?                          normal. ?                          Repeat examination of right colon under NBI  ?                          performed. ?                          There is no endoscopic evidence of polyps in the  ?                          entire colon. ?  A few small-mouthed diverticula were found in the  ?                          left colon. ?                          The exam was otherwise without abnormality on  ?                          direct and retroflexion views. ?Complications:            No immediate complications. ?Estimated Blood Loss:     Estimated blood loss: none. ?Impression:               - Diverticulosis in the left colon. ?                          - The examination was otherwise normal on direct  ?                          and retroflexion views. ?                          - No specimens collected. ?Recommendation:           - Patient has a contact number available for  ?                          emergencies. The signs and symptoms of potential  ?                          delayed complications were discussed with the  ?                          patient. Return to normal activities tomorrow.  ?                          Written discharge instructions were provided to the  ?                           patient. ?                          - Resume previous diet. ?                          - Continue present medications. ?                          - Repeat colonoscopy in 10 years for screening  ?                          purposes. ?Chen Saadeh L. Myrtie Neither, MD ?04/07/2022 4:23:58 PM ?This report has been signed electronically. ?

## 2022-04-07 NOTE — Progress Notes (Signed)
VS completed by CW.   Pt's states no medical or surgical changes since previsit or office visit.  

## 2022-04-07 NOTE — Patient Instructions (Signed)
Resume previous diet and medications. Repeat Colonoscopy in 10 years for screening purposes. ° °YOU HAD AN ENDOSCOPIC PROCEDURE TODAY AT THE [facility]:   Refer to the procedure report that was given to you for any specific questions about what was found during the examination.  If the procedure report does not answer your questions, please call your gastroenterologist to clarify.  If you requested that your care partner not be given the details of your procedure findings, then the procedure report has been included in a sealed envelope for you to review at your convenience later. ° °YOU SHOULD EXPECT: Some feelings of bloating in the abdomen. Passage of more gas than usual.  Walking can help get rid of the air that was put into your GI tract during the procedure and reduce the bloating. If you had a lower endoscopy (such as a colonoscopy or flexible sigmoidoscopy) you may notice spotting of blood in your stool or on the toilet paper. If you underwent a bowel prep for your procedure, you may not have a normal bowel movement for a few days. ° °Please Note:  You might notice some irritation and congestion in your nose or some drainage.  This is from the oxygen used during your procedure.  There is no need for concern and it should clear up in a day or so. ° °SYMPTOMS TO REPORT IMMEDIATELY: ° °Following lower endoscopy (colonoscopy or flexible sigmoidoscopy): ° Excessive amounts of blood in the stool ° Significant tenderness or worsening of abdominal pains ° Swelling of the abdomen that is new, acute ° Fever of 100°F or higher ° °For urgent or emergent issues, a gastroenterologist can be reached at any hour by calling [phone]. °Do not use MyChart messaging for urgent concerns.  ° ° °DIET:  We do recommend a small meal at first, but then you may proceed to your regular diet.  Drink plenty of fluids but you should avoid alcoholic beverages for 24 hours. ° °ACTIVITY:  You should plan to take it easy  for the rest of today and you should NOT DRIVE or use heavy machinery until tomorrow (because of the sedation medicines used during the test).   ° °FOLLOW UP: °Our staff will call the number listed on your records 48-72 hours following your procedure to check on you and address any questions or concerns that you may have regarding the information given to you following your procedure. If we do not reach you, we will leave a message.  We will attempt to reach you two times.  During this call, we will ask if you have developed any symptoms of COVID 19. If you develop any symptoms (ie: fever, flu-like symptoms, shortness of breath, cough etc.) before then, please call [phone].  If you test positive for Covid 19 in the 2 weeks post procedure, please call and report this information to us.   ° °If any biopsies were taken you will be contacted by phone or by letter within the next 1-3 weeks.  Please call us at [phone] if you have not heard about the biopsies in 3 weeks.  ° ° °SIGNATURES/CONFIDENTIALITY: °You and/or your care partner have signed paperwork which will be entered into your electronic medical record.  These signatures attest to the fact that that the information above on your After Visit Summary has been reviewed and is understood.  Full responsibility of the confidentiality of this discharge information lies with you and/or your care-partner.  °

## 2022-04-11 ENCOUNTER — Telehealth: Payer: Self-pay | Admitting: *Deleted

## 2022-04-11 NOTE — Telephone Encounter (Signed)
?  Follow up Call- ? ? ?  04/07/2022  ?  3:22 PM  ?Call back number  ?Post procedure Call Back phone  # [phone]  ?Permission to leave phone message Yes  ?  ? ?Patient questions: ? ?Do you have a fever, pain , or abdominal swelling? No. ?Pain Score  0 * ? ?Have you tolerated food without any problems? Yes.   ? ?Have you been able to return to your normal activities? Yes.   ? ?Do you have any questions about your discharge instructions: ?Diet   No. ?Medications  No. ?Follow up visit  No. ? ?Do you have questions or concerns about your Care? No. ? ?Actions: ?* If pain score is 4 or above: ?No action needed, pain <4. ? ? ?

## 2022-11-15 ENCOUNTER — Other Ambulatory Visit: Payer: Self-pay | Admitting: Obstetrics & Gynecology

## 2022-11-15 DIAGNOSIS — S301XXD Contusion of abdominal wall, subsequent encounter: Secondary | ICD-10-CM

## 2022-12-15 ENCOUNTER — Other Ambulatory Visit: Payer: Self-pay

## 2023-01-02 ENCOUNTER — Ambulatory Visit
Admission: EM | Admit: 2023-01-02 | Discharge: 2023-01-02 | Disposition: A | Discharge status: Home / Self Care | Payer: BLUE CROSS/BLUE SHIELD | Attending: Physician Assistant | Admitting: Physician Assistant

## 2023-01-02 DIAGNOSIS — L02411 Cutaneous abscess of right axilla: Secondary | ICD-10-CM | POA: Diagnosis not present

## 2023-01-02 MED ORDER — IBUPROFEN 600 MG PO TABS: 600.0000 mg | ORAL_TABLET | Freq: Four times a day (QID) | ORAL | 0 refills | Status: DC | PRN

## 2023-01-02 MED ORDER — DOXYCYCLINE HYCLATE 100 MG PO CAPS: 100.0000 mg | ORAL_CAPSULE | Freq: Two times a day (BID) | ORAL | 0 refills | Status: DC

## 2023-01-02 NOTE — Discharge Instructions (Signed)
Advised to continue to use warm compresses to the area frequently throughout the day to encourage drainage from the site.  Advised to take the doxycycline 100 mg every 12 hours to treat infection. Advised take ibuprofen 600 mg every 8 hours with food to help relieve pain and discomfort.  Advised to follow-up PCP or return to urgent care if symptoms fail to improve.

## 2023-01-02 NOTE — ED Triage Notes (Signed)
PT PRESENTS to [facility] with co of a abscess under right arm pit for 4 days pt has been using warm compresses with minimal improvement. Pain is throbbing.

## 2023-01-02 NOTE — ED Provider Notes (Signed)
[name]    CSN: [id number] Arrival date & time: 01/02/23  1532      History   Chief Complaint Chief Complaint  Patient presents with   Abscess    HPI Grace Todd is a 47 y.o. female.   47 year old female presents with an abscess right axilla.  Patient indicates for the past 4 days she has been having increasing pain right armpit, with redness, tenderness, soreness.  Patient indicates that the area has become very tender and has enlarged over the past couple days.  She relates that she has been using warm compresses to the area frequently without it opening up or draining.  Patient indicates that she has had abscesses before and she wants to have this 1 treated before it gets worse.  She is without fever or chills.   Abscess   Past Medical History:  Diagnosis Date   Abdominal hernia    Blood in stool    some spotting when she has her periods   Hx: UTI (urinary tract infection)    Obesity    Urine incontinence     Patient Active Problem List   Diagnosis Date Noted   Pain and swelling of right ankle 03/02/2022   Urinary incontinence 03/02/2022   History of hernia surgery 03/02/2022   Obesity (BMI 30-39.9) 03/02/2022   Vitamin D deficiency, unspecified 02/27/2018   Strangulated ventral hernia 02/14/2018    Past Surgical History:  Procedure Laterality Date   APPENDECTOMY     HERNIA REPAIR     starngulated ventral hernia   LAPAROSCOPY N/A 02/14/2018   Procedure: LAPAROSCOPY DIAGNOSTIC;  Surgeon: Gaynelle Adu, MD;  Location: [facility];  Service: General;  Laterality: N/A;  opened at 2039   TUBAL LIGATION  12/2005   UMBILICAL HERNIA REPAIR N/A 02/14/2018   Procedure: OPEN PRIMARY REPAIR OF STRANULAGED VENTRAL HERNIA, APPLICATION OF WOUND VAC;  Surgeon: Gaynelle Adu, MD;  Location: [facility] OR;  Service: General;  Laterality: N/A;  opened at 2039    OB History   No obstetric history on file.      Home Medications    Prior to Admission medications    Medication Sig Start Date End Date Taking? Authorizing Provider  doxycycline (VIBRAMYCIN) 100 MG capsule Take 1 capsule (100 mg total) by mouth 2 (two) times daily. 01/02/23  Yes Ellsworth Lennox, PA-C  ibuprofen (ADVIL) 600 MG tablet Take 1 tablet (600 mg total) by mouth every 6 (six) hours as needed. 01/02/23  Yes Ellsworth Lennox, PA-C  Misc Natural Products (CRANBERRY/PROBIOTIC PO) Take by mouth. Takes 2 pills once a day.    [provider]    Family History Family History  Problem Relation Age of Onset   Breast cancer Mother    Alcohol abuse Mother    Cancer Mother    Hyperlipidemia Mother    Stroke Mother    Early death Mother    High blood pressure Mother    Renal Disease Mother        died of renal failure   Kidney disease Mother    Hyperlipidemia Father    Heart disease Father    Hyperlipidemia Sister    Depression Sister    Colon cancer Maternal Aunt    Colon cancer Maternal Aunt    Stomach cancer Paternal Grandmother    Colon polyps Neg Hx    Esophageal cancer Neg Hx    Rectal cancer Neg Hx     Social History Social History   Tobacco  Use   Smoking status: Former    Types: Cigars   Smokeless tobacco: Current   Tobacco comments:    2 black and milds a day  she vapes   Vaping Use   Vaping Use: Some days  Substance Use Topics   Alcohol use: Yes    Comment: OCC   Drug use: No     Allergies   Ciprofloxacin and Sulfa antibiotics   Review of Systems Review of Systems  Skin:  Positive for wound (right axilla abscess).     Physical Exam Triage Vital Signs ED Triage Vitals  Enc Vitals Group     BP 01/02/23 1609 (!) 172/94     Pulse Rate 01/02/23 1609 64     Resp 01/02/23 1609 18     Temp 01/02/23 1609 97.9 F (36.6 C)     Temp src --      SpO2 01/02/23 1609 98 %     Weight --      Height --      Head Circumference --      Peak Flow --      Pain Score 01/02/23 1608 6     Pain Loc --      Pain Edu? --      Excl. in [place]? --    No data  found.  Updated Vital Signs BP (!) 172/94   Pulse 64   Temp 97.9 F (36.6 C)   Resp 18   LMP 11/30/2022   SpO2 98%   Visual Acuity Right Eye Distance:   Left Eye Distance:   Bilateral Distance:    Right Eye Near:   Left Eye Near:    Bilateral Near:     Physical Exam Constitutional:      Appearance: Normal appearance.  Skin:    Comments: Ella: There is a 1 cm reddened abscess that is raised with central fluctuant with out pointing or drainage.  There is a larger surrounding area underneath that measures 3 x 3 cm that is hard without fluctuance.  Beaking of the area.  Procedure: The 1 cm abscess is cleaned with Betadine, and then again with alcohol swab.  The area is anesthetized with lidocaine 1%, 1.5 cc injected.  The area is incised, and purulent drainage expressed, dressing is applied to the area, no complications.  Neurological:     Mental Status: She is alert.      UC Treatments / Results  Labs (all labs ordered are listed, but only abnormal results are displayed) Labs Reviewed - No data to display  EKG   Radiology No results found.  Procedures Procedures (including critical care time)  Medications Ordered in UC Medications - No data to display  Initial Impression / Assessment and Plan / UC Course  I have reviewed the triage vital signs and the nursing notes.  Pertinent labs & imaging results that were available during my care of the patient were reviewed by me and considered in my medical decision making (see chart for details).    Plan: The diagnosis will be treated with the following: 1.  Abscess of axilla: A.  Doxycycline 100 mg every 12 hours to treat infection. B.  Ibuprofen 600 mg every 6 hours with food to treat pain and swelling. C.  Advised warm compresses frequently to encourage drainage and resolution of the abscess. 2.  Patient advised follow-up PCP or return to urgent care if symptoms fail to improve. Final Clinical Impressions(s) / UC  Diagnoses   Final  diagnoses:  Abscess of axilla, right     Discharge Instructions      Advised to continue to use warm compresses to the area frequently throughout the day to encourage drainage from the site.  Advised to take the doxycycline 100 mg every 12 hours to treat infection. Advised take ibuprofen 600 mg every 8 hours with food to help relieve pain and discomfort.  Advised to follow-up PCP or return to urgent care if symptoms fail to improve.    ED Prescriptions     Medication Sig Dispense Auth. Provider   doxycycline (VIBRAMYCIN) 100 MG capsule Take 1 capsule (100 mg total) by mouth 2 (two) times daily. 20 capsule Nyoka Lint, PA-C   ibuprofen (ADVIL) 600 MG tablet Take 1 tablet (600 mg total) by mouth every 6 (six) hours as needed. 30 tablet Nyoka Lint, PA-C      PDMP not reviewed this encounter.   Nyoka Lint, PA-C 01/02/23 1644

## 2023-01-26 ENCOUNTER — Ambulatory Visit
Admission: EM | Admit: 2023-01-26 | Discharge: 2023-01-26 | Disposition: A | Discharge status: Home / Self Care | Payer: BLUE CROSS/BLUE SHIELD | Attending: Physician Assistant | Admitting: Physician Assistant

## 2023-01-26 DIAGNOSIS — N3000 Acute cystitis without hematuria: Secondary | ICD-10-CM

## 2023-01-26 LAB — POCT URINALYSIS DIP (MANUAL ENTRY)
Bilirubin, UA: NEGATIVE
Glucose, UA: NEGATIVE mg/dL
Ketones, POC UA: NEGATIVE mg/dL
Nitrite, UA: NEGATIVE
Protein Ur, POC: 100 mg/dL — AB
Spec Grav, UA: 1.025 (ref 1.010–1.025)
Urobilinogen, UA: 0.2 E.U./dL
pH, UA: 6.5 (ref 5.0–8.0)

## 2023-01-26 MED ORDER — NITROFURANTOIN MONOHYD MACRO 100 MG PO CAPS: 100.0000 mg | ORAL_CAPSULE | Freq: Two times a day (BID) | ORAL | 0 refills | Status: DC

## 2023-01-26 NOTE — ED Provider Notes (Signed)
[name]    CSN: [id number] Arrival date & time: 01/26/23  1434      History   Chief Complaint Chief Complaint  Patient presents with   Urinary Frequency    HPI Grace Todd is a 47 y.o. female.   Patient here today for evaluation of urinary frequency that started yesterday.  She denies any fevers.  She has not had any abdominal pain or back pain.  She does not report treatment for symptoms.   The history is provided by the patient.  Urinary Frequency Pertinent negatives include no abdominal pain.    Past Medical History:  Diagnosis Date   Abdominal hernia    Blood in stool    some spotting when she has her periods   Hx: UTI (urinary tract infection)    Obesity    Urine incontinence     Patient Active Problem List   Diagnosis Date Noted   Pain and swelling of right ankle 03/02/2022   Urinary incontinence 03/02/2022   History of hernia surgery 03/02/2022   Obesity (BMI 30-39.9) 03/02/2022   Vitamin D deficiency, unspecified 02/27/2018   Strangulated ventral hernia 02/14/2018    Past Surgical History:  Procedure Laterality Date   APPENDECTOMY     HERNIA REPAIR     starngulated ventral hernia   LAPAROSCOPY N/A 02/14/2018   Procedure: LAPAROSCOPY DIAGNOSTIC;  Surgeon: Greer Pickerel, MD;  Location: [place];  Service: General;  Laterality: N/A;  opened at 2039   TUBAL LIGATION  0000000   UMBILICAL HERNIA REPAIR N/A 02/14/2018   Procedure: OPEN PRIMARY REPAIR OF STRANULAGED VENTRAL HERNIA, APPLICATION OF WOUND VAC;  Surgeon: Greer Pickerel, MD;  Location: [facility] OR;  Service: General;  Laterality: N/A;  opened at 2039    OB History   No obstetric history on file.      Home Medications    Prior to Admission medications   Medication Sig Start Date End Date Taking? Authorizing Provider  nitrofurantoin, macrocrystal-monohydrate, (MACROBID) 100 MG capsule Take 1 capsule (100 mg total) by mouth 2 (two) times daily. 01/26/23  Yes Francene Finders,  PA-C  ibuprofen (ADVIL) 600 MG tablet Take 1 tablet (600 mg total) by mouth every 6 (six) hours as needed. 01/02/23   Nyoka Lint, PA-C  Misc Natural Products (CRANBERRY/PROBIOTIC PO) Take by mouth. Takes 2 pills once a day.    [provider]    Family History Family History  Problem Relation Age of Onset   Breast cancer Mother    Alcohol abuse Mother    Cancer Mother    Hyperlipidemia Mother    Stroke Mother    Early death Mother    High blood pressure Mother    Renal Disease Mother        died of renal failure   Kidney disease Mother    Hyperlipidemia Father    Heart disease Father    Hyperlipidemia Sister    Depression Sister    Colon cancer Maternal Aunt    Colon cancer Maternal Aunt    Stomach cancer Paternal Grandmother    Colon polyps Neg Hx    Esophageal cancer Neg Hx    Rectal cancer Neg Hx     Social History Social History   Tobacco Use   Smoking status: Former    Types: Cigars   Smokeless tobacco: Current   Tobacco comments:    2 black and milds a day  she vapes   Vaping Use   Vaping Use:  Some days  Substance Use Topics   Alcohol use: Yes    Comment: OCC   Drug use: No     Allergies   Ciprofloxacin and Sulfa antibiotics   Review of Systems Review of Systems  Constitutional:  Negative for chills and fever.  Eyes:  Negative for discharge and redness.  Gastrointestinal:  Negative for abdominal pain, nausea and vomiting.  Genitourinary:  Positive for frequency.     Physical Exam Triage Vital Signs ED Triage Vitals  Enc Vitals Group     BP 01/26/23 1620 (!) 152/97     Pulse Rate 01/26/23 1620 80     Resp 01/26/23 1620 18     Temp 01/26/23 1620 98.2 F (36.8 C)     Temp Source 01/26/23 1620 Oral     SpO2 01/26/23 1620 98 %     Weight --      Height --      Head Circumference --      Peak Flow --      Pain Score 01/26/23 1621 2     Pain Loc --      Pain Edu? --      Excl. in [place]? --    No data found.  Updated Vital  Signs BP (!) 152/97 (BP Location: Left Arm)   Pulse 80   Temp 98.2 F (36.8 C) (Oral)   Resp 18   LMP 11/30/2022   SpO2 98%   Visual Acuity Right Eye Distance:   Left Eye Distance:   Bilateral Distance:    Right Eye Near:   Left Eye Near:    Bilateral Near:     Physical Exam Vitals and nursing note reviewed.  Constitutional:      General: She is not in acute distress.    Appearance: Normal appearance. She is not ill-appearing.  HENT:     Head: Normocephalic and atraumatic.  Eyes:     Conjunctiva/sclera: Conjunctivae normal.  Cardiovascular:     Rate and Rhythm: Normal rate.  Pulmonary:     Effort: Pulmonary effort is normal. No respiratory distress.  Neurological:     Mental Status: She is alert.  Psychiatric:        Mood and Affect: Mood normal.        Behavior: Behavior normal.        Thought Content: Thought content normal.      UC Treatments / Results  Labs (all labs ordered are listed, but only abnormal results are displayed) Labs Reviewed  POCT URINALYSIS DIP (MANUAL ENTRY) - Abnormal; Notable for the following components:      Result Value   Clarity, UA cloudy (*)    Blood, UA large (*)    Protein Ur, POC =100 (*)    Leukocytes, UA Small (1+) (*)    All other components within normal limits  URINE CULTURE    EKG   Radiology No results found.  Procedures Procedures (including critical care time)  Medications Ordered in UC Medications - No data to display  Initial Impression / Assessment and Plan / UC Course  I have reviewed the triage vital signs and the nursing notes.  Pertinent labs & imaging results that were available during my care of the patient were reviewed by me and considered in my medical decision making (see chart for details).  Will treat to cover UTI with Macrobid.  Urine culture ordered.  Encouraged follow-up if no gradual improvement with any further concerns.    Final Clinical Impressions(s) /  UC Diagnoses   Final  diagnoses:  Acute cystitis without hematuria   Discharge Instructions   None    ED Prescriptions     Medication Sig Dispense Auth. Provider   nitrofurantoin, macrocrystal-monohydrate, (MACROBID) 100 MG capsule Take 1 capsule (100 mg total) by mouth 2 (two) times daily. 10 capsule Francene Finders, PA-C      PDMP not reviewed this encounter.   Francene Finders, PA-C 01/26/23 1835

## 2023-01-26 NOTE — ED Triage Notes (Signed)
Pt presents with urinary frequency since yesterday.

## 2023-01-28 LAB — URINE CULTURE: Culture: >=100000 — AB

## 2023-04-18 ENCOUNTER — Ambulatory Visit
Admission: EM | Admit: 2023-04-18 | Discharge: 2023-04-18 | Disposition: A | Discharge status: Home / Self Care | Payer: BLUE CROSS/BLUE SHIELD | Attending: Nurse Practitioner | Admitting: Nurse Practitioner

## 2023-04-18 DIAGNOSIS — R350 Frequency of micturition: Secondary | ICD-10-CM | POA: Diagnosis not present

## 2023-04-18 LAB — POCT URINALYSIS DIP (MANUAL ENTRY)
Bilirubin, UA: NEGATIVE
Blood, UA: NEGATIVE
Glucose, UA: NEGATIVE mg/dL
Ketones, POC UA: NEGATIVE mg/dL
Nitrite, UA: NEGATIVE
Protein Ur, POC: NEGATIVE mg/dL
Spec Grav, UA: 1.02 (ref 1.010–1.025)
Urobilinogen, UA: 0.2 E.U./dL
pH, UA: 5.5 (ref 5.0–8.0)

## 2023-04-18 MED ORDER — NITROFURANTOIN MONOHYD MACRO 100 MG PO CAPS: 100.0000 mg | ORAL_CAPSULE | Freq: Two times a day (BID) | ORAL | 0 refills | Status: AC

## 2023-04-18 MED ORDER — IBUPROFEN 600 MG PO TABS: 600.0000 mg | ORAL_TABLET | Freq: Four times a day (QID) | ORAL | 0 refills | Status: AC | PRN

## 2023-04-18 NOTE — ED Triage Notes (Signed)
Pt c/o back pain onset ~ 1 week ago states "last night it started pulling" and concerned for infection. Associated frequency.

## 2023-04-18 NOTE — ED Provider Notes (Signed)
[name]    CSN: [id number] Arrival date & time: 04/18/23  1105      History   Chief Complaint Chief Complaint  Patient presents with   Urinary Frequency    HPI Grace Todd is a 47 y.o. female.   Subjective:  Grace Todd is a 47 y.o. female who complains of urinary frequency and low back pain for the past week or so.  She reports that the pain in her back feels like a pulling sensation.  Patient denies any dysuria, vaginal odor, vaginal discharge, fevers, nausea or vomiting.  Patient does not have a history of recurrent UTI.  Patient does not have a history of pyelonephritis.  Patient is sexually active with her husband of over 20 years.  She is not on birth control and has a history of tubal ligation.  Last menstrual period was around 03/31/2023.  Patient has not tried anything for her symptoms.  The following portions of the patient's history were reviewed and updated as appropriate: allergies, current medications, past family history, past medical history, past social history, past surgical history, and problem list.        Past Medical History:  Diagnosis Date   Abdominal hernia    Blood in stool    some spotting when she has her periods   Hx: UTI (urinary tract infection)    Obesity    Urine incontinence     Patient Active Problem List   Diagnosis Date Noted   Pain and swelling of right ankle 03/02/2022   Urinary incontinence 03/02/2022   History of hernia surgery 03/02/2022   Obesity (BMI 30-39.9) 03/02/2022   Vitamin D deficiency, unspecified 02/27/2018   Strangulated ventral hernia 02/14/2018    Past Surgical History:  Procedure Laterality Date   APPENDECTOMY     HERNIA REPAIR     starngulated ventral hernia   LAPAROSCOPY N/A 02/14/2018   Procedure: LAPAROSCOPY DIAGNOSTIC;  Surgeon: Gaynelle Adu, MD;  Location: [facility] OR;  Service: General;  Laterality: N/A;  opened at 2039   TUBAL LIGATION  12/2005   UMBILICAL HERNIA REPAIR N/A  02/14/2018   Procedure: OPEN PRIMARY REPAIR OF STRANULAGED VENTRAL HERNIA, APPLICATION OF WOUND VAC;  Surgeon: Gaynelle Adu, MD;  Location: [facility] OR;  Service: General;  Laterality: N/A;  opened at 2039    OB History   No obstetric history on file.      Home Medications    Prior to Admission medications   Medication Sig Start Date End Date Taking? Authorizing Provider  ibuprofen (ADVIL) 600 MG tablet Take 1 tablet (600 mg total) by mouth every 6 (six) hours as needed for moderate pain or cramping. 04/18/23  Yes Lurline Idol, FNP  nitrofurantoin, macrocrystal-monohydrate, (MACROBID) 100 MG capsule Take 1 capsule (100 mg total) by mouth 2 (two) times daily. 04/18/23  Yes Lurline Idol, FNP    Family History Family History  Problem Relation Age of Onset   Breast cancer Mother    Alcohol abuse Mother    Cancer Mother    Hyperlipidemia Mother    Stroke Mother    Early death Mother    High blood pressure Mother    Renal Disease Mother        died of renal failure   Kidney disease Mother    Hyperlipidemia Father    Heart disease Father    Hyperlipidemia Sister    Depression Sister    Colon cancer Maternal Aunt    Colon cancer Maternal Aunt  Stomach cancer Paternal Grandmother    Colon polyps Neg Hx    Esophageal cancer Neg Hx    Rectal cancer Neg Hx     Social History Social History   Tobacco Use   Smoking status: Former    Types: Cigars   Smokeless tobacco: Current   Tobacco comments:    2 black and milds a day  she vapes   Vaping Use   Vaping Use: Some days  Substance Use Topics   Alcohol use: Yes    Comment: OCC   Drug use: No     Allergies   Ciprofloxacin and Sulfa antibiotics   Review of Systems Review of Systems  Constitutional:  Negative for fever.  Gastrointestinal:  Negative for nausea and vomiting.  Genitourinary:  Positive for frequency. Negative for dysuria, flank pain, hematuria and vaginal discharge.  Musculoskeletal:  Negative for back  pain.  All other systems reviewed and are negative.    Physical Exam Triage Vital Signs ED Triage Vitals [04/18/23 1213]  Enc Vitals Group     BP (!) 142/86     Pulse Rate 76     Resp 16     Temp 98.4 F (36.9 C)     Temp Source Oral     SpO2 97 %     Weight      Height      Head Circumference      Peak Flow      Pain Score 8     Pain Loc      Pain Edu?      Excl. in [place]?    No data found.  Updated Vital Signs BP (!) 142/86 (BP Location: Right Arm)   Pulse 76   Temp 98.4 F (36.9 C) (Oral)   Resp 16   SpO2 97%   Visual Acuity Right Eye Distance:   Left Eye Distance:   Bilateral Distance:    Right Eye Near:   Left Eye Near:    Bilateral Near:     Physical Exam Vitals reviewed.  Constitutional:      Appearance: Normal appearance.  HENT:     Head: Normocephalic.  Cardiovascular:     Rate and Rhythm: Normal rate.  Pulmonary:     Effort: Pulmonary effort is normal.  Abdominal:     Palpations: Abdomen is soft.     Tenderness: There is no right CVA tenderness or left CVA tenderness.  Musculoskeletal:        General: Normal range of motion.     Cervical back: Normal range of motion.  Skin:    General: Skin is warm and dry.  Neurological:     General: No focal deficit present.     Mental Status: She is alert and oriented to person, place, and time.      UC Treatments / Results  Labs (all labs ordered are listed, but only abnormal results are displayed) Labs Reviewed  POCT URINALYSIS DIP (MANUAL ENTRY) - Abnormal; Notable for the following components:      Result Value   Clarity, UA cloudy (*)    Leukocytes, UA Trace (*)    All other components within normal limits  URINE CULTURE    EKG   Radiology No results found.  Procedures Procedures (including critical care time)  Medications Ordered in UC Medications - No data to display  Initial Impression / Assessment and Plan / UC Course  I have reviewed the triage vital signs and the  nursing notes.  Pertinent labs & imaging results that were available during my care of the patient were reviewed by me and considered in my medical decision making (see chart for details).    47 year old female presenting with urinary frequency and low back pain.  She is afebrile and nontoxic.  Urinalysis with trace amount of leukocytes.  Patient insistent on antibiotics that she thinks that her symptoms are due to a UTI.  Will empirically provide Macrobid.  Urine cultures pending.  She is aware that she will be instructed to stop the antibiotics if urine cultures are negative.  Today's evaluation has revealed no signs of a dangerous process. Discussed diagnosis with patient and/or guardian. Patient and/or guardian aware of their diagnosis, possible red flag symptoms to watch out for and need for close follow up. Patient and/or guardian understands verbal and written discharge instructions. Patient and/or guardian comfortable with plan and disposition.  Patient and/or guardian has a clear mental status at this time, good insight into illness (after discussion and teaching) and has clear judgment to make decisions regarding their care  Documentation was completed with the aid of voice recognition software. Transcription may contain typographical errors. Final Clinical Impressions(s) / UC Diagnoses   Final diagnoses:  Urinary frequency   Discharge Instructions   None    ED Prescriptions     Medication Sig Dispense Auth. Provider   ibuprofen (ADVIL) 600 MG tablet Take 1 tablet (600 mg total) by mouth every 6 (six) hours as needed for moderate pain or cramping. 30 tablet Atwood Adcock, [place], FNP   nitrofurantoin, macrocrystal-monohydrate, (MACROBID) 100 MG capsule Take 1 capsule (100 mg total) by mouth 2 (two) times daily. 10 capsule Lurline Idol, FNP      PDMP not reviewed this encounter.   Lurline Idol, [place] 04/18/23 1341

## 2023-04-19 LAB — URINE CULTURE
